# Patient Record
Sex: Male | Born: 1940 | Race: White | Hispanic: No | Smoking: Former smoker
Health system: Southern US, Community
[De-identification: ages and names within clinical notes are randomized; demographics above are authoritative.]

## PROBLEM LIST (undated history)

## (undated) DIAGNOSIS — R519 Headache, unspecified: Secondary | ICD-10-CM

## (undated) DIAGNOSIS — Z1501 Genetic susceptibility to malignant neoplasm of breast: Secondary | ICD-10-CM

## (undated) DIAGNOSIS — C50919 Malignant neoplasm of unspecified site of unspecified female breast: Secondary | ICD-10-CM

## (undated) DIAGNOSIS — N189 Chronic kidney disease, unspecified: Secondary | ICD-10-CM

## (undated) DIAGNOSIS — Z87442 Personal history of urinary calculi: Secondary | ICD-10-CM

## (undated) DIAGNOSIS — I10 Essential (primary) hypertension: Secondary | ICD-10-CM

## (undated) DIAGNOSIS — R011 Cardiac murmur, unspecified: Secondary | ICD-10-CM

## (undated) DIAGNOSIS — K219 Gastro-esophageal reflux disease without esophagitis: Secondary | ICD-10-CM

## (undated) DIAGNOSIS — E119 Type 2 diabetes mellitus without complications: Secondary | ICD-10-CM

## (undated) DIAGNOSIS — M1990 Unspecified osteoarthritis, unspecified site: Secondary | ICD-10-CM

## (undated) HISTORY — PX: MOHS SURGERY: SUR867

## (undated) HISTORY — DX: Gastro-esophageal reflux disease without esophagitis: K21.9

## (undated) HISTORY — PX: HERNIA REPAIR: SHX51

## (undated) HISTORY — DX: Malignant neoplasm of unspecified site of unspecified female breast: C50.919

## (undated) HISTORY — PX: COLONOSCOPY: SHX174

## (undated) HISTORY — PX: TONSILLECTOMY: SUR1361

## (undated) HISTORY — PX: FRACTURE SURGERY: SHX138

## (undated) HISTORY — PX: CHOLECYSTECTOMY: SHX55

---

## 2007-01-08 HISTORY — PX: MASTECTOMY: SHX3

## 2018-02-16 ENCOUNTER — Emergency Department
Admission: EM | Admit: 2018-02-16 | Discharge: 2018-02-17 | Disposition: A | Discharge status: Home / Self Care | Payer: Medicare Other | Attending: Emergency Medicine | Admitting: Emergency Medicine

## 2018-02-16 ENCOUNTER — Other Ambulatory Visit: Payer: Self-pay

## 2018-02-16 DIAGNOSIS — E119 Type 2 diabetes mellitus without complications: Secondary | ICD-10-CM | POA: Insufficient documentation

## 2018-02-16 DIAGNOSIS — I10 Essential (primary) hypertension: Secondary | ICD-10-CM | POA: Diagnosis not present

## 2018-02-16 DIAGNOSIS — R040 Epistaxis: Secondary | ICD-10-CM | POA: Insufficient documentation

## 2018-02-16 MED ORDER — TRANEXAMIC ACID 1000 MG/10ML IV SOLN
500.0000 mg | Freq: Once | INTRAVENOUS | Status: DC
  Filled 2018-02-16: qty 10

## 2018-02-16 MED ORDER — OXYMETAZOLINE HCL 0.05 % NA SOLN
1.0000 | Freq: Once | NASAL | Status: AC
  Administered 2018-02-16: 1 via NASAL
  Filled 2018-02-16: qty 30

## 2018-02-16 NOTE — ED Provider Notes (Signed)
[facility] Emergency Department Provider Note  ____________________________________________  Time seen: Approximately 11:07 PM  I have reviewed the triage vital signs and the nursing notes.   HISTORY  Chief Complaint Epistaxis   HPI Angel Salazar is a 78 y.o. male with a history of diabetes, hypertension, hyperlipidemia who presents for evaluation of epistaxis.  Patient reports epistaxis from the right nare which has been present for about an hour.  Has been unable to stop it at home.  Is on blood thinners.  He reports one prior episode of epistaxis which required a visit to the emergency room.  He takes enalapril for his blood pressure which she has taken this morning.  He denies headache or trauma.  PMH HTN DM HLD  Allergies Patient has no allergy information on record.  FH Heart disease Mother    Lymphoma Mother  non-hodgkins  Asthma Son     Social History Smoking - no Alcohol - yes Drugs - no  Review of Systems  Constitutional: Negative for fever. Eyes: Negative for visual changes. ENT: Negative for sore throat. + epistaxis Neck: No neck pain  Cardiovascular: Negative for chest pain. Respiratory: Negative for shortness of breath. Gastrointestinal: Negative for abdominal pain, vomiting or diarrhea. Genitourinary: Negative for dysuria. Musculoskeletal: Negative for back pain. Skin: Negative for rash. Neurological: Negative for headaches, weakness or numbness. Psych: No SI or HI  ____________________________________________   PHYSICAL EXAM:  VITAL SIGNS: ED Triage Vitals  Enc Vitals Group     BP 02/16/18 2201 (!) 171/93     Pulse Rate 02/16/18 2201 77     Resp 02/16/18 2201 20     Temp --      Temp src --      SpO2 02/16/18 2201 100 %     Weight 02/16/18 2202 165 lb (74.8 kg)     Height --      Head Circumference --      Peak Flow --      Pain Score 02/16/18 2202 0     Pain Loc --      Pain Edu? --      Excl. in  [place]? --     Constitutional: Alert and oriented. Well appearing and in no apparent distress. HEENT:      Head: Normocephalic and atraumatic.         Eyes: Conjunctivae are normal. Sclera is non-icteric.      Nose: No obvious source of bleeding       Mouth/Throat: Mucous membranes are moist.       Neck: Supple with no signs of meningismus. Cardiovascular: Regular rate and rhythm. No murmurs, gallops, or rubs. 2+ symmetrical distal pulses are present in all extremities. No JVD. Respiratory: Normal respiratory effort. Lungs are clear to auscultation bilaterally. No wheezes, crackles, or rhonchi.  Musculoskeletal: Nontender with normal range of motion in all extremities. No edema, cyanosis, or erythema of extremities. Neurologic: Normal speech and language. Face is symmetric. Moving all extremities. No gross focal neurologic deficits are appreciated. Skin: Skin is warm, dry and intact. No rash noted. Psychiatric: Mood and affect are normal. Speech and behavior are normal.  ____________________________________________   LABS (all labs ordered are listed, but only abnormal results are displayed)  Labs Reviewed - No data to display ____________________________________________  EKG  none  ____________________________________________  RADIOLOGY  none  ____________________________________________   PROCEDURES  Procedure(s) performed: yes Procedures   Epistaxis Management Date/Time: 02/16/2018 at 11:09 PM Performed by: Rudene Re Authorized  by: Rudene Re Consent: Verbal consent obtained. Written consent not obtained. Risks and benefits: risks, benefits and alternatives were discussed Consent given by: patient Required items: required blood products, implants, devices, and special equipment available Patient sedated: no Repair method: afrin and pressure Post-procedure assessment: bleeding stopped after 15 minutes with gauze in place Treatment complexity:  simple Patient tolerance: Patient tolerated the procedure well with no immediate complications   Critical Care performed:  None ____________________________________________   INITIAL IMPRESSION / ASSESSMENT AND PLAN / ED COURSE   78 y.o. male with a history of diabetes, hypertension, hyperlipidemia who presents for evaluation of epistaxis.  Upon arrival to the room Afrin was applied and pressure for 20 minutes.  After that, I evaluated patient with no active bleeding and no obvious source of bleeding on the anterior nasopharynx. Patient monitored for 3min and dc home on afrin and f/u with PCP      As part of my medical decision making, I reviewed the following data within the [facility] notes reviewed and incorporated, Old chart reviewed, Notes from prior ED visits and [place] Controlled Substance Database    Pertinent labs & imaging results that were available during my care of the patient were reviewed by me and considered in my medical decision making (see chart for details).    ____________________________________________   FINAL CLINICAL IMPRESSION(S) / ED DIAGNOSES  Final diagnoses:  Epistaxis      NEW MEDICATIONS STARTED DURING THIS VISIT:  ED Discharge Orders    None       Note:  This document was prepared using Dragon voice recognition software and may include unintentional dictation errors.    Rudene Re, MD 02/16/18 [phone]

## 2018-02-16 NOTE — ED Notes (Signed)
Pt stated that his nose started bleeding prior to coming to the ED. Pt stated that this happened once before and he was told that it was due to his nose being dry. Nose clamp was taken off to admin nasal spray and nose clamp then reapplied.

## 2018-02-16 NOTE — ED Triage Notes (Signed)
Pt in with co nosebleed that started prior to coming in, active bleeding noted from bilat nostrils. Pt is not on blood thinners hx of the same last year.

## 2018-02-17 NOTE — ED Notes (Signed)
No bleeding after clamp removed x 20 min

## 2021-05-10 ENCOUNTER — Other Ambulatory Visit: Payer: Self-pay | Admitting: Family Medicine

## 2021-05-10 DIAGNOSIS — N6321 Unspecified lump in the left breast, upper outer quadrant: Secondary | ICD-10-CM

## 2021-05-18 ENCOUNTER — Other Ambulatory Visit: Payer: Self-pay | Admitting: Family Medicine

## 2021-05-18 DIAGNOSIS — N6321 Unspecified lump in the left breast, upper outer quadrant: Secondary | ICD-10-CM

## 2021-05-22 ENCOUNTER — Ambulatory Visit
Admission: RE | Admit: 2021-05-22 | Discharge: 2021-05-22 | Disposition: A | Discharge status: Home / Self Care | Payer: Medicare Other | Source: Ambulatory Visit | Attending: Family Medicine | Admitting: Family Medicine

## 2021-05-22 DIAGNOSIS — N6321 Unspecified lump in the left breast, upper outer quadrant: Secondary | ICD-10-CM | POA: Diagnosis not present

## 2021-05-24 ENCOUNTER — Other Ambulatory Visit: Payer: Self-pay | Admitting: Family Medicine

## 2021-05-24 DIAGNOSIS — R928 Other abnormal and inconclusive findings on diagnostic imaging of breast: Secondary | ICD-10-CM

## 2021-05-24 DIAGNOSIS — N63 Unspecified lump in unspecified breast: Secondary | ICD-10-CM

## 2021-06-06 ENCOUNTER — Ambulatory Visit
Admission: RE | Admit: 2021-06-06 | Discharge: 2021-06-06 | Disposition: A | Discharge status: Home / Self Care | Payer: Medicare Other | Source: Ambulatory Visit | Attending: Family Medicine | Admitting: Family Medicine

## 2021-06-06 DIAGNOSIS — R928 Other abnormal and inconclusive findings on diagnostic imaging of breast: Secondary | ICD-10-CM | POA: Insufficient documentation

## 2021-06-06 DIAGNOSIS — N63 Unspecified lump in unspecified breast: Secondary | ICD-10-CM | POA: Diagnosis present

## 2021-06-06 HISTORY — PX: BREAST BIOPSY: SHX20

## 2021-06-11 ENCOUNTER — Encounter: Payer: Self-pay | Admitting: *Deleted

## 2021-06-11 ENCOUNTER — Other Ambulatory Visit: Payer: Self-pay | Admitting: *Deleted

## 2021-06-11 DIAGNOSIS — C50919 Malignant neoplasm of unspecified site of unspecified female breast: Secondary | ICD-10-CM

## 2021-06-11 NOTE — Progress Notes (Signed)
Received referral from Dr. Peyton Najjar for medical oncology appointment.   He saw the patient today and wants him seed by med onc this week.   Appt. Scheduled with Dr. Tasia Catchings for Friday 6/9.  Patient aware of appt. Details.

## 2021-06-13 ENCOUNTER — Other Ambulatory Visit: Payer: Self-pay | Admitting: General Surgery

## 2021-06-13 DIAGNOSIS — C50912 Malignant neoplasm of unspecified site of left female breast: Secondary | ICD-10-CM

## 2021-06-13 DIAGNOSIS — Z170 Estrogen receptor positive status [ER+]: Secondary | ICD-10-CM

## 2021-06-14 ENCOUNTER — Encounter: Payer: Self-pay | Admitting: Oncology

## 2021-06-14 LAB — SURGICAL PATHOLOGY

## 2021-06-15 ENCOUNTER — Other Ambulatory Visit: Payer: Self-pay

## 2021-06-15 ENCOUNTER — Ambulatory Visit: Payer: Self-pay | Admitting: General Surgery

## 2021-06-15 ENCOUNTER — Inpatient Hospital Stay: Payer: Medicare Other

## 2021-06-15 ENCOUNTER — Encounter: Payer: Self-pay | Admitting: *Deleted

## 2021-06-15 ENCOUNTER — Inpatient Hospital Stay: Payer: Medicare Other | Attending: Oncology | Admitting: Oncology

## 2021-06-15 ENCOUNTER — Encounter: Payer: Self-pay | Admitting: Oncology

## 2021-06-15 VITALS — BP 159/89 | HR 66 | Temp 97.8°F | Resp 20 | Wt 161.9 lb

## 2021-06-15 DIAGNOSIS — C50822 Malignant neoplasm of overlapping sites of left male breast: Secondary | ICD-10-CM

## 2021-06-15 DIAGNOSIS — Z170 Estrogen receptor positive status [ER+]: Secondary | ICD-10-CM

## 2021-06-15 DIAGNOSIS — Z1501 Genetic susceptibility to malignant neoplasm of breast: Secondary | ICD-10-CM

## 2021-06-15 DIAGNOSIS — C50919 Malignant neoplasm of unspecified site of unspecified female breast: Secondary | ICD-10-CM

## 2021-06-15 DIAGNOSIS — Z7189 Other specified counseling: Secondary | ICD-10-CM

## 2021-06-15 LAB — COMPREHENSIVE METABOLIC PANEL
ALT: 21 U/L (ref 0–44)
AST: 25 U/L (ref 15–41)
Albumin: 4.5 g/dL (ref 3.5–5.0)
Alkaline Phosphatase: 25 U/L — ABNORMAL LOW (ref 38–126)
Anion gap: 9 (ref 5–15)
BUN: 23 mg/dL (ref 8–23)
CO2: 26 mmol/L (ref 22–32)
Calcium: 9.3 mg/dL (ref 8.9–10.3)
Chloride: 102 mmol/L (ref 98–111)
Creatinine, Ser: 1.23 mg/dL (ref 0.61–1.24)
GFR, Estimated: >60 mL/min (ref >60)
Glucose, Bld: 122 mg/dL — ABNORMAL HIGH (ref 70–99)
Potassium: 3.9 mmol/L (ref 3.5–5.1)
Sodium: 137 mmol/L (ref 135–145)
Total Bilirubin: 1.4 mg/dL — ABNORMAL HIGH (ref 0.3–1.2)
Total Protein: 7.3 g/dL (ref 6.5–8.1)

## 2021-06-15 LAB — CBC WITH DIFFERENTIAL/PLATELET
Abs Immature Granulocytes: 0.02 10*3/uL (ref 0.00–0.07)
Basophils Absolute: 0 10*3/uL (ref 0.0–0.1)
Basophils Relative: 1 %
Eosinophils Absolute: 0.1 10*3/uL (ref 0.0–0.5)
Eosinophils Relative: 2 %
HCT: 42.6 % (ref 39.0–52.0)
Hemoglobin: 14.6 g/dL (ref 13.0–17.0)
Immature Granulocytes: 0 %
Lymphocytes Relative: 27 %
Lymphs Abs: 1.6 10*3/uL (ref 0.7–4.0)
MCH: 32.7 pg (ref 26.0–34.0)
MCHC: 34.3 g/dL (ref 30.0–36.0)
MCV: 95.3 fL (ref 80.0–100.0)
Monocytes Absolute: 0.7 10*3/uL (ref 0.1–1.0)
Monocytes Relative: 11 %
Neutro Abs: 3.6 10*3/uL (ref 1.7–7.7)
Neutrophils Relative %: 59 %
Platelets: 153 10*3/uL (ref 150–400)
RBC: 4.47 MIL/uL (ref 4.22–5.81)
RDW: 12.2 % (ref 11.5–15.5)
WBC: 6 10*3/uL (ref 4.0–10.5)
nRBC: 0 % (ref 0.0–0.2)

## 2021-06-15 NOTE — Progress Notes (Signed)
Accompanied patient to initial medical oncology appointment.   Care plan summary given to patient.   Reviewed outreach programs and cancer center services.

## 2021-06-15 NOTE — Research (Cosign Needed Addendum)
Trial: [employer] 2021-05 " Specimen Collection Study to Evaluate Biomarker in Subjects with Cancer"   Patient Angel Salazar was identified by Angel Fruit, RN as a potential candidate for the above listed study.  This Clinical Research Nurse met with Angel Salazar, [id number], on 06/15/21 in a manner and location that ensures patient privacy to discuss participation in the above listed research study.  Patient is Unaccompanied.  A copy of the informed consent document and separate HIPAA Authorization was provided to the patient.  Patient reads, speaks, and understands [place].    Patient was provided with the business card of this Nurse and encouraged to contact the research team with any questions.  Patient was provided the option of taking informed consent documents home to review and was encouraged to review at their convenience with their support network, including other care providers. Patient is comfortable with making a decision regarding study participation today.  As outlined in the informed consent form, this Nurse and Angel Salazar discussed the purpose of the research study, the investigational nature of the study, study procedures and requirements for study participation, potential risks and benefits of study participation, as well as alternatives to participation. This study is not blinded. The patient understands participation is voluntary and they may withdraw from study participation at any time.  This study does not involve randomization.  This study does not involve an investigational drug or device. This study does not involve a placebo. Patient understands enrollment is pending full eligibility review.   Confidentiality and how the patient's information will be used as part of study participation were discussed.  Patient was informed there is reimbursement provided for their time and effort spent on trial participation.  The patient is encouraged to discuss research study  participation with their insurance provider to determine what costs they may incur as part of study participation, including research related injury.    All questions were answered to patient's satisfaction.  The informed consent and separate HIPAA Authorization was reviewed page by page.  The patient's mental and emotional status is appropriate to provide informed consent, and the patient verbalizes an understanding of study participation.  Patient has agreed to participate in the above listed research study and has voluntarily signed the informed consent dated 12/06/2021 version 3 and separate HIPAA Authorization, version 5  on 06/15/21 at 10:28AM.  The patient was provided with a copy of the signed informed consent form and separate HIPAA Authorization for their reference.  No study specific procedures were obtained prior to the signing of the informed consent document.  Approximately 30 minutes were spent with the patient reviewing the informed consent documents.  After obtaining informed consent patient, voluntarily signed the optional Release of Information form for use throughout trial participation.  Angel Fruit, RN 06/15/21 11:04 AM     This Nurse has reviewed this patient's inclusion and exclusion criteria as a second review and confirms Angel Salazar is eligible for study participation.  Patient may continue with enrollment.   Angel Marion, RN, BSN, [facility] Clinical Research Nurse Lead 06/15/2021 11:05 AM  Eligibility: Eligibility criteria reviewed with patient. This Nurse has reviewed this patient's inclusion and exclusion criteria and confirmed patient is eligible for study participation. Eligibility confirmed by treating investigator, who also agrees that patient should proceed with enrollment. Patient will continue with enrollment. Blood Collection: Research blood obtained by Angel Salazar in the phlebotomy lab via fresh venipuncture per patient's preference. Patient tolerated  well without any adverse events. Gift Card: $50  gift card given to patient for her participation in this study by [employer], [place].   Medical History:  High Blood Pressure  No Coronary Artery Disease No Lupus    No Rheumatoid Arthritis  No Diabetes   No      If yes, which type?      N/A  Lynch Syndrome  No  Is the patient currently taking a magnesium supplement?   No If yes, dose and frequency? N/A  Indication? N/A  Start date? N/A   Does the patient have a personal history of cancer (greater than 5 years ago)?  Yes If yes, Cancer type and date of diagnosis?   Breast  Has this previous diagnosis been treated? Yes      If so, treatment type? Total mastectomy and Tamoxifen for 5 years    Start and end dates of last treatment cycle? 2009-2014  Does the patient have a family history of cancer in 1st or 2nd degree relatives? Yes If yes, Relationship(s) and Cancer type(s)? Mother ( Lung NHL) Grandfather ( paternal )- CLL, Grandfather ( maternal )- unknown, Grandmother ( paternal )- Unknown  Does the patient have history of alcohol consumption? Yes   If yes, current or former? Current If former, year stopped? N/A  Number of years? 75 Drinks per week? 7  Does the patient have history of cigarette, cigar, pipe, or chewing tobacco use?  Yes If yes, current for former? Former If yes, type (Cigarette, cigar, pipe, and/or chewing tobacco)? Cigarette / Pipe  If former, year stopped? 1976 Number of years? 14 Packs/number/containers per day? Angel [place], RN 06/15/21 11:19 AM

## 2021-06-15 NOTE — H&P (Signed)
PATIENT PROFILE: Angel Salazar is a 81 y.o. male who presents to the Clinic for consultation at the request of Dr. Baldemar Lenis for evaluation of left breast cancer.  PCP: Barnabas Lister, MD  HISTORY OF PRESENT ILLNESS: Angel Salazar reports he felt a mass in the lateral side of the left breast. He mentioned it to his PCP. Diagnostic mammogram and ultrasound was ordered. Diagnostic mammogram and ultrasound shows a 1 cm mass. I personally evaluated the images. This led to core biopsy. 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 FISH in process.  Patient has history of right breast cancer in 2005. He had mastectomy with sentinel node biopsy. 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 She received tamoxifen for 5 years. No radiation.  Family history of breast cancer: None Family history of other cancers: Multiple family members with blood cancer Menarche: N/A Menopause: N/A Used OCP: N/A Used estrogen and progesterone therapy: N/A History of Radiation to the chest: None  PROBLEM LIST: Problem List Date Reviewed: 05/10/2021   Noted  GERD (gastroesophageal reflux disease) 02/24/2020  Esophageal dysphagia 02/24/2020  Colon cancer screening 02/24/2020  Chronic renal insufficiency, stage 3 (moderate) (CMS-HCC) 02/24/2020  Type 2 diabetes mellitus without complication, without long-term current use of insulin (CMS-HCC) 10/06/2017  Hypercholesterolemia 10/06/2017  Essential hypertension 10/06/2017  BPH associated with nocturia 10/06/2017   GENERAL REVIEW OF SYSTEMS:   General ROS: negative for - chills, fatigue, fever, weight gain or weight loss Allergy and Immunology ROS: negative for - hives  Hematological and Lymphatic ROS: negative for - bleeding problems or bruising, negative for palpable nodes Endocrine ROS: negative for - heat or cold intolerance, hair changes Respiratory ROS: negative for - cough, shortness of  breath or wheezing Cardiovascular ROS: no chest pain or palpitations GI ROS: negative for nausea, vomiting, abdominal pain, diarrhea, constipation Musculoskeletal ROS: negative for - joint swelling or muscle pain Neurological ROS: negative for - confusion, syncope Dermatological ROS: negative for pruritus and rash Psychiatric: negative for anxiety, depression, difficulty sleeping and memory loss  MEDICATIONS: Current Outpatient Medications  Medication Sig Dispense Refill  acetaminophen (TYLENOL) 500 MG tablet Take 1,000 mg by mouth as needed for Pain  enalapril (VASOTEC) 10 MG tablet TAKE 1 TABLET(10 MG) BY MOUTH EVERY DAY 90 tablet 1  finasteride (PROSCAR) 5 mg tablet TAKE 1 TABLET(5 MG) BY MOUTH EVERY DAY 90 tablet 1  meloxicam (MOBIC) 15 MG tablet TAKE 1 TABLET BY MOUTH DAILY CONTINOUSLY AS NEEDED 30 tablet 1  metFORMIN (GLUCOPHAGE-XR) 500 MG XR tablet TAKE 1 TABLET(500 MG) BY MOUTH DAILY WITH DINNER 90 tablet 1  omeprazole (PRILOSEC) 40 MG DR capsule TAKE 1 CAPSULE(40 MG) BY MOUTH EVERY DAY 30 capsule 11  rosuvastatin (CRESTOR) 10 MG tablet TAKE 1 TABLET(10 MG) BY MOUTH EVERY DAY 90 tablet 1  aspirin 81 MG EC tablet Take 1 tablet (81 mg total) by mouth once daily (Patient not taking: Reported on 06/11/2021) 30 tablet 11   No current facility-administered medications for this visit.   ALLERGIES: Patient has no known allergies.  PAST MEDICAL HISTORY: Past Medical History:  Diagnosis Date  Cancer (CMS-HCC)  breast  Cataract cortical, senile  Diabetes mellitus type 2, uncomplicated (CMS-HCC)  Hyperlipidemia  Hypertension  Kidney stones  Skin cancer   PAST SURGICAL HISTORY: Past Surgical History:  Procedure Laterality Date  EGD 04/04/2020  Gastritis/No repeat/TKT  CHOLECYSTECTOMY  HERNIA REPAIR Bilateral  inguinal  MASTECTOMY PARTIAL Right  TONSILLECTOMY  FAMILY HISTORY: Family History  Problem Relation Age of Onset  Heart disease Mother  Lymphoma Mother   non-hodgkins  Asthma Son    SOCIAL HISTORY: Social History   Socioeconomic History  Marital status: Divorced  Tobacco Use  Smoking status: Former  Types: Cigarettes  Quit date: 1976  Years since quitting: 47.4  Smokeless tobacco: Never  Vaping Use  Vaping Use: Never used  Substance and Sexual Activity  Alcohol use: Yes  Drug use: Not Currently  Sexual activity: Not Currently  Partners: Female   PHYSICAL EXAM: Vitals:  06/11/21 1018  BP: (!) 157/85  Pulse: 80   Body mass index is 25.99 kg/m. Weight: 73 kg (161 lb)   GENERAL: Alert, active, oriented x3  HEENT: Pupils equal reactive to light. Extraocular movements are intact. Sclera clear. Palpebral conjunctiva normal red color.Pharynx clear.  NECK: Supple with no palpable mass and no adenopathy.  LUNGS: Sound clear with no rales rhonchi or wheezes.  HEART: Regular rhythm S1 and S2 without murmur.  BREAST: abnormal mass palpable on the left breast at 3 o'clock position. There is a bruise from recent biopsy.  ABDOMEN: Soft and depressible, nontender with no palpable mass, no hepatomegaly.  EXTREMITIES: Well-developed well-nourished symmetrical with no dependent edema.  NEUROLOGICAL: Awake alert oriented, facial expression symmetrical, moving all extremities.  REVIEW OF DATA: I have reviewed the following data today: Appointment on 05/03/2021  Component Date Value  WBC (White Blood Cell Co* 05/03/2021 5.5  RBC (Red Blood Cell Coun* 05/03/2021 4.63 (L)  Hemoglobin 05/03/2021 15.1  Hematocrit 05/03/2021 43.3  MCV (Mean Corpuscular Vo* 05/03/2021 93.5  MCH (Mean Corpuscular He* 05/03/2021 32.6 (H)  MCHC (Mean Corpuscular H* 05/03/2021 34.9  Platelet Count 05/03/2021 145 (L)  RDW-CV (Red Cell Distrib* 05/03/2021 12.3  MPV (Mean Platelet Volum* 05/03/2021 10.0  Neutrophils 05/03/2021 2.80  Lymphocytes 05/03/2021 2.00  Mixed Count 05/03/2021 0.70  Neutrophil % 05/03/2021 51.0  Lymphocyte % 05/03/2021 37.0   Mixed % 05/03/2021 12.0  Glucose 05/03/2021 115 (H)  Sodium 05/03/2021 141  Potassium 05/03/2021 4.4  Chloride 05/03/2021 105  Carbon Dioxide (CO2) 05/03/2021 29.8  Urea Nitrogen (BUN) 05/03/2021 27 (H)  Creatinine 05/03/2021 1.3  Glomerular Filtration Ra* 05/03/2021 53 (L)  Calcium 05/03/2021 10.0  AST 05/03/2021 19  ALT 05/03/2021 21  Alk Phos (alkaline Phosp* 05/03/2021 25 (L)  Albumin 05/03/2021 4.4  Bilirubin, Total 05/03/2021 1.1  Protein, Total 05/03/2021 6.6  A/G Ratio 05/03/2021 2.0  Hemoglobin A1C 05/03/2021 6.0 (H)  Average Blood Glucose (C* 05/03/2021 126  Cholesterol, Total 05/03/2021 146  Triglyceride 05/03/2021 93  HDL (High Density Lipopr* 05/03/2021 77.6 (H)  LDL Calculated 05/03/2021 50  VLDL Cholesterol 05/03/2021 19  Cholesterol/HDL Ratio 05/03/2021 1.9  PSA (Prostate Specific A* 05/03/2021 1.50  Thyroid Stimulating Horm* 05/03/2021 2.487    ASSESSMENT: Mr. Docter is a 81 y.o. male presenting for consultation for left breast cancer.   Patient was oriented again about the pathology results. Surgical alternatives were discussed with patient including partial vs total mastectomy. Surgical technique and post operative care was discussed with patient. Risk of surgery was discussed with patient including but not limited to: wound infection, seroma, hematoma, brachial plexopathy, mondor's disease (thrombosis of small veins of breast), chronic wound pain, breast lymphedema, altered sensation to the nipple and cosmesis among others.   Patient has history of right breast cancer treated with total mastectomy and sentinel node biopsy. He received adjuvant antiestrogen therapy for 5 years. No radiation therapy needed.  He was positive  for BRCA2. Both daughters negative for BRCA abnormality. He has a son that has not been tested.  Patient now with invasive mammary carcinoma, no special type. 1 mm size mass on ultrasound. Disease ER positive, PR positive, HER2 difficult.  FISH is in process.  Pending medical oncology evaluation.  I will avoid medical oncology final decision before deciding to proceed with total mastectomy and sentinel needle biopsy.  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 Left total mastectomy with axillary sentinel lymph node biopsy  Patient and and daughter that was on the phone verbalized understanding, all questions were answered, and were agreeable with the plan outlined above.   Herbert Pun, MD

## 2021-06-15 NOTE — H&P (View-Only) (Signed)
PATIENT PROFILE: Angel Salazar is a 81 y.o. male who presents to the Clinic for consultation at the request of Dr. Baldemar Lenis for evaluation of left breast cancer.  PCP: Barnabas Lister, MD  HISTORY OF PRESENT ILLNESS: Angel Salazar reports he felt a mass in the lateral side of the left breast. He mentioned it to his PCP. Diagnostic mammogram and ultrasound was ordered. Diagnostic mammogram and ultrasound shows a 1 cm mass. I personally evaluated the images. This led to core biopsy. 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 FISH in process.  Patient has history of right breast cancer in 2005. He had mastectomy with sentinel node biopsy. 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 She received tamoxifen for 5 years. No radiation.  Family history of breast cancer: None Family history of other cancers: Multiple family members with blood cancer Menarche: N/A Menopause: N/A Used OCP: N/A Used estrogen and progesterone therapy: N/A History of Radiation to the chest: None  PROBLEM LIST: Problem List Date Reviewed: 05/10/2021   Noted  GERD (gastroesophageal reflux disease) 02/24/2020  Esophageal dysphagia 02/24/2020  Colon cancer screening 02/24/2020  Chronic renal insufficiency, stage 3 (moderate) (CMS-HCC) 02/24/2020  Type 2 diabetes mellitus without complication, without long-term current use of insulin (CMS-HCC) 10/06/2017  Hypercholesterolemia 10/06/2017  Essential hypertension 10/06/2017  BPH associated with nocturia 10/06/2017   GENERAL REVIEW OF SYSTEMS:   General ROS: negative for - chills, fatigue, fever, weight gain or weight loss Allergy and Immunology ROS: negative for - hives  Hematological and Lymphatic ROS: negative for - bleeding problems or bruising, negative for palpable nodes Endocrine ROS: negative for - heat or cold intolerance, hair changes Respiratory ROS: negative for - cough, shortness of  breath or wheezing Cardiovascular ROS: no chest pain or palpitations GI ROS: negative for nausea, vomiting, abdominal pain, diarrhea, constipation Musculoskeletal ROS: negative for - joint swelling or muscle pain Neurological ROS: negative for - confusion, syncope Dermatological ROS: negative for pruritus and rash Psychiatric: negative for anxiety, depression, difficulty sleeping and memory loss  MEDICATIONS: Current Outpatient Medications  Medication Sig Dispense Refill  acetaminophen (TYLENOL) 500 MG tablet Take 1,000 mg by mouth as needed for Pain  enalapril (VASOTEC) 10 MG tablet TAKE 1 TABLET(10 MG) BY MOUTH EVERY DAY 90 tablet 1  finasteride (PROSCAR) 5 mg tablet TAKE 1 TABLET(5 MG) BY MOUTH EVERY DAY 90 tablet 1  meloxicam (MOBIC) 15 MG tablet TAKE 1 TABLET BY MOUTH DAILY CONTINOUSLY AS NEEDED 30 tablet 1  metFORMIN (GLUCOPHAGE-XR) 500 MG XR tablet TAKE 1 TABLET(500 MG) BY MOUTH DAILY WITH DINNER 90 tablet 1  omeprazole (PRILOSEC) 40 MG DR capsule TAKE 1 CAPSULE(40 MG) BY MOUTH EVERY DAY 30 capsule 11  rosuvastatin (CRESTOR) 10 MG tablet TAKE 1 TABLET(10 MG) BY MOUTH EVERY DAY 90 tablet 1  aspirin 81 MG EC tablet Take 1 tablet (81 mg total) by mouth once daily (Patient not taking: Reported on 06/11/2021) 30 tablet 11   No current facility-administered medications for this visit.   ALLERGIES: Patient has no known allergies.  PAST MEDICAL HISTORY: Past Medical History:  Diagnosis Date  Cancer (CMS-HCC)  breast  Cataract cortical, senile  Diabetes mellitus type 2, uncomplicated (CMS-HCC)  Hyperlipidemia  Hypertension  Kidney stones  Skin cancer   PAST SURGICAL HISTORY: Past Surgical History:  Procedure Laterality Date  EGD 04/04/2020  Gastritis/No repeat/TKT  CHOLECYSTECTOMY  HERNIA REPAIR Bilateral  inguinal  MASTECTOMY PARTIAL Right  TONSILLECTOMY  FAMILY HISTORY: Family History  Problem Relation Age of Onset  Heart disease Mother  Lymphoma Mother   non-hodgkins  Asthma Son    SOCIAL HISTORY: Social History   Socioeconomic History  Marital status: Divorced  Tobacco Use  Smoking status: Former  Types: Cigarettes  Quit date: 1976  Years since quitting: 47.4  Smokeless tobacco: Never  Vaping Use  Vaping Use: Never used  Substance and Sexual Activity  Alcohol use: Yes  Drug use: Not Currently  Sexual activity: Not Currently  Partners: Female   PHYSICAL EXAM: Vitals:  06/11/21 1018  BP: (!) 157/85  Pulse: 80   Body mass index is 25.99 kg/m. Weight: 73 kg (161 lb)   GENERAL: Alert, active, oriented x3  HEENT: Pupils equal reactive to light. Extraocular movements are intact. Sclera clear. Palpebral conjunctiva normal red color.Pharynx clear.  NECK: Supple with no palpable mass and no adenopathy.  LUNGS: Sound clear with no rales rhonchi or wheezes.  HEART: Regular rhythm S1 and S2 without murmur.  BREAST: abnormal mass palpable on the left breast at 3 o'clock position. There is a bruise from recent biopsy.  ABDOMEN: Soft and depressible, nontender with no palpable mass, no hepatomegaly.  EXTREMITIES: Well-developed well-nourished symmetrical with no dependent edema.  NEUROLOGICAL: Awake alert oriented, facial expression symmetrical, moving all extremities.  REVIEW OF DATA: I have reviewed the following data today: Appointment on 05/03/2021  Component Date Value  WBC (White Blood Cell Co* 05/03/2021 5.5  RBC (Red Blood Cell Coun* 05/03/2021 4.63 (L)  Hemoglobin 05/03/2021 15.1  Hematocrit 05/03/2021 43.3  MCV (Mean Corpuscular Vo* 05/03/2021 93.5  MCH (Mean Corpuscular He* 05/03/2021 32.6 (H)  MCHC (Mean Corpuscular H* 05/03/2021 34.9  Platelet Count 05/03/2021 145 (L)  RDW-CV (Red Cell Distrib* 05/03/2021 12.3  MPV (Mean Platelet Volum* 05/03/2021 10.0  Neutrophils 05/03/2021 2.80  Lymphocytes 05/03/2021 2.00  Mixed Count 05/03/2021 0.70  Neutrophil % 05/03/2021 51.0  Lymphocyte % 05/03/2021 37.0   Mixed % 05/03/2021 12.0  Glucose 05/03/2021 115 (H)  Sodium 05/03/2021 141  Potassium 05/03/2021 4.4  Chloride 05/03/2021 105  Carbon Dioxide (CO2) 05/03/2021 29.8  Urea Nitrogen (BUN) 05/03/2021 27 (H)  Creatinine 05/03/2021 1.3  Glomerular Filtration Ra* 05/03/2021 53 (L)  Calcium 05/03/2021 10.0  AST 05/03/2021 19  ALT 05/03/2021 21  Alk Phos (alkaline Phosp* 05/03/2021 25 (L)  Albumin 05/03/2021 4.4  Bilirubin, Total 05/03/2021 1.1  Protein, Total 05/03/2021 6.6  A/G Ratio 05/03/2021 2.0  Hemoglobin A1C 05/03/2021 6.0 (H)  Average Blood Glucose (C* 05/03/2021 126  Cholesterol, Total 05/03/2021 146  Triglyceride 05/03/2021 93  HDL (High Density Lipopr* 05/03/2021 77.6 (H)  LDL Calculated 05/03/2021 50  VLDL Cholesterol 05/03/2021 19  Cholesterol/HDL Ratio 05/03/2021 1.9  PSA (Prostate Specific A* 05/03/2021 1.50  Thyroid Stimulating Horm* 05/03/2021 2.487    ASSESSMENT: Mr. Ainsley is a 80 y.o. male presenting for consultation for left breast cancer.   Patient was oriented again about the pathology results. Surgical alternatives were discussed with patient including partial vs total mastectomy. Surgical technique and post operative care was discussed with patient. Risk of surgery was discussed with patient including but not limited to: wound infection, seroma, hematoma, brachial plexopathy, mondor's disease (thrombosis of small veins of breast), chronic wound pain, breast lymphedema, altered sensation to the nipple and cosmesis among others.   Patient has history of right breast cancer treated with total mastectomy and sentinel node biopsy. He received adjuvant antiestrogen therapy for 5 years. No radiation therapy needed.  He was positive   for BRCA2. Both daughters negative for BRCA abnormality. He has a son that has not been tested.  Patient now with invasive mammary carcinoma, no special type. 1 mm size mass on ultrasound. Disease ER positive, PR positive, HER2 difficult.  FISH is in process.  Pending medical oncology evaluation.  I will avoid medical oncology final decision before deciding to proceed with total mastectomy and sentinel needle biopsy.  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 Left total mastectomy with axillary sentinel lymph node biopsy  Patient and and daughter that was on the phone verbalized understanding, all questions were answered, and were agreeable with the plan outlined above.   Herbert Pun, MD

## 2021-06-15 NOTE — Research (Signed)
Erroneous entry please disregard.  Jeral Fruit, RN 06/15/21 12:11 PM

## 2021-06-16 DIAGNOSIS — Z7189 Other specified counseling: Secondary | ICD-10-CM | POA: Insufficient documentation

## 2021-06-16 DIAGNOSIS — Z1501 Genetic susceptibility to malignant neoplasm of breast: Secondary | ICD-10-CM | POA: Insufficient documentation

## 2021-06-16 DIAGNOSIS — C50919 Malignant neoplasm of unspecified site of unspecified female breast: Secondary | ICD-10-CM | POA: Insufficient documentation

## 2021-06-16 LAB — CANCER ANTIGEN 27.29: CA 27.29: 33.6 U/mL (ref 0.0–38.6)

## 2021-06-16 NOTE — Assessment & Plan Note (Signed)
Imaging and pathology results were reviewed and discussed with patient. I agree with mastectomy with sentinel lymph node biopsy. Plan to send Oncotype DX after the surgery. Discussed with patient about adjuvant endocrine therapy.

## 2021-06-16 NOTE — Assessment & Plan Note (Signed)
Patient also has increased risk of pancreatic cancer, prostate cancer, melanoma. Dermatology evaluation annually. Prostate cancer screening

## 2021-06-16 NOTE — Progress Notes (Signed)
Hematology/Oncology Consult note [phone] [phone]         Patient Care Team: Medicine, [facility] Family as PCP - General  REFERRING PROVIDER: Herbert Pun, *  CHIEF COMPLAINTS/REASON FOR VISIT:  Breast cancer  HISTORY OF PRESENTING ILLNESS:   Angel Salazar is a  81 y.o.  male with PMH listed below was seen in consultation at the request of  Herbert Pun, *  for evaluation of breast cancer.  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  Patient took 5 years of tamoxifen.  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  Oncology History  Invasive carcinoma of breast ([place])  05/22/2021 Mammogram   Left breast mammogram showed 3 o'clock location of the LEFT breast. Ultrasound left breast showed mass is estimated to measure 1 x 0.3 x 0.7 cm.  Left axilla is negative for adenopathy.   06/06/2021 Cancer Staging   Staging form: Breast, AJCC 8th Edition - Clinical stage from 06/06/2021: Stage IA (cT1b, cN0, cM0, G2, ER+, PR+, HER2-) - Signed by Earlie Server, MD on 06/16/2021 Stage prefix: Initial diagnosis Histologic grading system: 3 grade system HER2-IHC interpretation: Equivocal HER2-IHC value: Score 2+ HER2-FISH interpretation: Negative   06/16/2021 Initial Diagnosis   Invasive carcinoma of breast ([place])    Genetic Testing   patient reports that he has had genetic testing done in the past.  He has BRCA2 mutation         Review of Systems  Constitutional:  Negative for appetite change, chills, fatigue, fever and unexpected weight change.  HENT:   Negative for hearing loss and voice change.   Eyes:  Negative for eye problems and icterus.  Respiratory:  Negative for chest tightness, cough and shortness of breath.   Cardiovascular:  Negative for chest pain and leg swelling.  Gastrointestinal:  Negative for abdominal distention and abdominal pain.  Endocrine: Negative for hot  flashes.  Genitourinary:  Negative for difficulty urinating, dysuria and frequency.   Musculoskeletal:  Negative for arthralgias.  Skin:  Negative for itching and rash.  Neurological:  Negative for light-headedness and numbness.  Hematological:  Negative for adenopathy. Does not bruise/bleed easily.  Psychiatric/Behavioral:  Negative for confusion.     MEDICAL HISTORY:  Past Medical History:  Diagnosis Date   Breast cancer ([place])    GERD (gastroesophageal reflux disease)     SURGICAL HISTORY: Past Surgical History:  Procedure Laterality Date   BREAST BIOPSY Left 06/06/2021   MASTECTOMY Right 2009    SOCIAL HISTORY: Social History   Socioeconomic History   Marital status: Divorced    Spouse name: Not on file   Number of children: Not on file   Years of education: Not on file   Highest education level: Not on file  Occupational History   Not on file  Tobacco Use   Smoking status: Never   Smokeless tobacco: Never  Substance and Sexual Activity   Alcohol use: Yes   Drug use: Not Currently   Sexual activity: Not Currently    Birth control/protection: None  Other Topics Concern   Not on file  Social History Narrative   Not on file   Social Determinants of Health   Financial Resource Strain: Not on file  Food Insecurity: Not on file  Transportation Needs: Not on file  Physical Activity: Not on file  Stress: Not on file  Social Connections: Not on file  Intimate Partner Violence: Not on file    FAMILY HISTORY:  History reviewed. No pertinent family history.  ALLERGIES:  has No Known Allergies.  MEDICATIONS:  Current Outpatient Medications  Medication Sig Dispense Refill   enalapril (VASOTEC) 10 MG tablet Take 10 mg by mouth daily.     finasteride (PROSCAR) 5 MG tablet Take 5 mg by mouth daily.     metFORMIN (GLUCOPHAGE-XR) 500 MG 24 hr tablet Take 500 mg by mouth daily.     omeprazole (PRILOSEC) 40 MG capsule Take 40 mg by mouth daily.     rosuvastatin  (CRESTOR) 10 MG tablet Take 10 mg by mouth at bedtime.     acetaminophen (TYLENOL) 650 MG CR tablet Take 1,300 mg by mouth every 8 (eight) hours as needed for pain.     Alpha-Lipoic Acid 600 MG TABS Take 600 mg by mouth daily.     aspirin EC 81 MG tablet Take 81 mg by mouth daily. Swallow whole.     carboxymethylcellulose (REFRESH PLUS) 0.5 % SOLN Place 1 drop into both eyes daily as needed (dry eyes).     Cholecalciferol (VITAMIN D) 125 MCG (5000 UT) CAPS Take 5,000 Units by mouth daily.     Coenzyme Q10 (COQ-10) 400 MG CAPS Take 400 mg by mouth daily.     fexofenadine (ALLEGRA) 180 MG tablet Take 180 mg by mouth daily as needed for allergies or rhinitis.     folic acid (FOLVITE) 094 MCG tablet Take 800 mcg by mouth daily.     glucosamine-chondroitin 500-400 MG tablet Take 1 tablet by mouth 3 (three) times daily.     LUTEIN PO Take 1 tablet by mouth daily.     Multiple Vitamin (MULTIVITAMIN) capsule Take 1 capsule by mouth daily.     Omega 3-6-9 Fatty Acids (TRIPLE OMEGA-3-6-9) CAPS Take 1 capsule by mouth 3 (three) times daily.     Probiotic Product (PROBIOTIC-10 ULTIMATE) CAPS Take 1 capsule by mouth daily.     saline (AYR) GEL Place 1 application  into both nostrils every 6 (six) hours as needed (dry nasal passages).     TURMERIC CURCUMIN PO Take 1,750 mg by mouth daily.     vitamin B-12 (CYANOCOBALAMIN) 1000 MCG tablet Take 1,000 mcg by mouth daily.     No current facility-administered medications for this visit.     PHYSICAL EXAMINATION: ECOG PERFORMANCE STATUS: 0 - Asymptomatic Vitals:   06/15/21 0922  BP: (!) 159/89  Pulse: 66  Resp: 20  Temp: 97.8 F (36.6 C)  SpO2: 100%   Filed Weights   06/15/21 0922  Weight: 161 lb 14.4 oz (73.4 kg)    Physical Exam Constitutional:      General: He is not in acute distress. HENT:     Head: Normocephalic and atraumatic.  Eyes:     General: No scleral icterus. Cardiovascular:     Rate and Rhythm: Normal rate and regular rhythm.      Heart sounds: Normal heart sounds.  Pulmonary:     Effort: Pulmonary effort is normal. No respiratory distress.     Breath sounds: No wheezing.  Abdominal:     General: Bowel sounds are normal. There is no distension.     Palpations: Abdomen is soft.  Musculoskeletal:        General: No deformity. Normal range of motion.     Cervical back: Normal range of motion and neck supple.  Skin:    General: Skin is warm and dry.     Findings: No erythema or rash.  Neurological:  Mental Status: He is alert and oriented to person, place, and time. Mental status is at baseline.     Cranial Nerves: No cranial nerve deficit.     Coordination: Coordination normal.  Psychiatric:        Mood and Affect: Mood normal.   Breast exam was performed in seated and lying down position. Post right mastectomy. There is a palpable 0.5cm cystic lesion at previous mastectomy site. Left breast status postbiopsy with focal ecchymosis/bruising.  Palpable 3:00 left breast mass.   LABORATORY DATA:  I have reviewed the data as listed Lab Results  Component Value Date   WBC 6.0 06/15/2021   HGB 14.6 06/15/2021   HCT 42.6 06/15/2021   MCV 95.3 06/15/2021   PLT 153 06/15/2021   Recent Labs    06/15/21 1046  NA 137  K 3.9  CL 102  CO2 26  GLUCOSE 122*  BUN 23  CREATININE 1.23  CALCIUM 9.3  GFRNONAA >60  PROT 7.3  ALBUMIN 4.5  AST 25  ALT 21  ALKPHOS 25*  BILITOT 1.4*   Iron/TIBC/Ferritin/ %Sat No results found for: "IRON", "TIBC", "FERRITIN", "IRONPCTSAT"    RADIOGRAPHIC STUDIES: I have personally reviewed the radiological images as listed and agreed with the findings in the report. [place] LT BREAST BX W LOC DEV 1ST LESION IMG BX SPEC US GUIDE  Addendum Date: 06/08/2021   ADDENDUM REPORT: 06/08/2021 13:20 ADDENDUM: Pathology revealed GRADE 2 INVASIVE MAMMARY CARCINOMA, NO SPECIAL TYPE, DUCTAL CARCINOMA IN SITU of the LEFT breast 3 o'clock, 1 cmfn (heart clip). This was found to be concordant  by Dr. Zerita Boers. The patient reported doing well after the biopsy with tenderness at the site. Post biopsy instructions and care were reviewed and questions were answered. The patient was encouraged to call [facility] for any additional concerns. Pathology results were discussed with the patient by telephone with Dr. Derinda Late of [facility] who will arrange surgical referral. Pathology results reported by Stacie Acres RN on 06/08/2021. Electronically Signed   By: Zerita Boers M.D.   On: 06/08/2021 13:20   Result Date: 06/08/2021 CLINICAL DATA:  Here for ultrasound-guided biopsy of a mass in the left breast. EXAM: ULTRASOUND GUIDED LEFT BREAST CORE NEEDLE BIOPSY COMPARISON:  Multiple prior exams. PROCEDURE: I met with the patient and we discussed the procedure of ultrasound-guided biopsy, including benefits and alternatives. We discussed the high likelihood of a successful procedure. We discussed the risks of the procedure, including infection, bleeding, tissue injury, clip migration, and inadequate sampling. Informed written consent was given. The usual time-out protocol was performed immediately prior to the procedure. Lesion quadrant: Upper outer quadrant Using sterile technique and 1% Lidocaine as local anesthetic, under direct ultrasound visualization, a 12 gauge spring-loaded device was used to perform biopsy of an irregular mass in the left breast at 3 o'clock 1 cm nipple using a lateral approach. At the conclusion of the procedure a heart shaped tissue marker clip was deployed into the biopsy cavity. Follow up 2 view mammogram was performed and dictated separately. IMPRESSION: Ultrasound guided biopsy of the left breast. No apparent complications. Electronically Signed: By: Zerita Boers M.D. On: 06/06/2021 09:22   MM CLIP PLACEMENT LEFT  Result Date: 06/06/2021 CLINICAL DATA:  Status post ultrasound-guided biopsy of a mass in the  left breast. EXAM: 3D DIAGNOSTIC LEFT MAMMOGRAM POST ULTRASOUND BIOPSY COMPARISON:  Previous exam(s). FINDINGS: 3D Mammographic images were obtained following ultrasound guided biopsy  of a mass in the left breast. The biopsy marking clip is in expected position at the site of biopsy. IMPRESSION: Appropriate positioning of the heart shaped biopsy marking clip at the site of biopsy in the left breast. Final Assessment: Post Procedure Mammograms for Marker Placement Electronically Signed   By: Zerita Boers M.D.   On: 06/06/2021 08:52  MM DIAG BREAST TOMO UNI LEFT  Result Date: 05/22/2021 CLINICAL DATA:  Palpable mass in the LEFT breast. 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 Patient has no family history of breast cancer. EXAM: DIGITAL DIAGNOSTIC UNILATERAL LEFT MAMMOGRAM WITH TOMOSYNTHESIS AND CAD; ULTRASOUND LEFT BREAST LIMITED TECHNIQUE: Left digital diagnostic mammography and breast tomosynthesis was performed. The images were evaluated with computer-aided detection.; Targeted ultrasound examination of the left breast was performed. COMPARISON:  Previous images are not available; reports are available in Epic from [facility] from May of 2009 ACR Breast Density Category b: There are scattered areas of fibroglandular density. FINDINGS: BB marks area of concern, corresponding to an irregular mass with indistinct margins in the LATERAL portion of the LEFT breast. There are associated internal calcifications. On physical exam, palpate a discrete superficial firm mobile mass in the 3 o'clock location of the LEFT breast 1 centimeter from the nipple. Targeted ultrasound is performed, showing an oval mass with indistinct margins in the 3 o'clock location of the LEFT breast 1 centimeter from the nipple. Mass is indistinct, making assessment of size less accurate. However, mass is  estimated to measure 1.0 x 0.3 x 0.7 centimeters. Internal blood flow is present by Doppler evaluation. Evaluation of the LEFT axilla is negative for adenopathy. IMPRESSION: Suspicious mass in the 3 o'clock location of the LEFT breast for which biopsy is recommended. No LEFT axillary adenopathy. RECOMMENDATION: Ultrasound-guided core biopsy of LEFT breast mass. I have discussed the findings and recommendations with the patient. If applicable, a reminder letter will be sent to the patient regarding the next appointment. BI-RADS CATEGORY  4: Suspicious. Electronically Signed   By: Nolon Nations M.D.   On: 05/22/2021 12:13  US BREAST LTD UNI LEFT INC AXILLA  Result Date: 05/22/2021 CLINICAL DATA:  Palpable mass in the LEFT breast. 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 Patient has no family history of breast cancer. EXAM: DIGITAL DIAGNOSTIC UNILATERAL LEFT MAMMOGRAM WITH TOMOSYNTHESIS AND CAD; ULTRASOUND LEFT BREAST LIMITED TECHNIQUE: Left digital diagnostic mammography and breast tomosynthesis was performed. The images were evaluated with computer-aided detection.; Targeted ultrasound examination of the left breast was performed. COMPARISON:  Previous images are not available; reports are available in Epic from [facility] from May of 2009 ACR Breast Density Category b: There are scattered areas of fibroglandular density. FINDINGS: BB marks area of concern, corresponding to an irregular mass with indistinct margins in the LATERAL portion of the LEFT breast. There are associated internal calcifications. On physical exam, palpate a discrete superficial firm mobile mass in the 3 o'clock location of the LEFT breast 1 centimeter from the nipple. Targeted ultrasound is performed, showing an oval mass with indistinct margins in the 3 o'clock location of the LEFT breast 1 centimeter from  the nipple. Mass is indistinct, making assessment of size less accurate. However, mass is estimated to measure 1.0 x 0.3 x 0.7 centimeters. Internal blood  flow is present by Doppler evaluation. Evaluation of the LEFT axilla is negative for adenopathy. IMPRESSION: Suspicious mass in the 3 o'clock location of the LEFT breast for which biopsy is recommended. No LEFT axillary adenopathy. RECOMMENDATION: Ultrasound-guided core biopsy of LEFT breast mass. I have discussed the findings and recommendations with the patient. If applicable, a reminder letter will be sent to the patient regarding the next appointment. BI-RADS CATEGORY  4: Suspicious. Electronically Signed   By: Nolon Nations M.D.   On: 05/22/2021 12:13     ASSESSMENT & PLAN:   Cancer Staging  Invasive carcinoma of breast ([place]) Staging form: Breast, AJCC 8th Edition - Clinical stage from 06/06/2021: Stage IA (cT1b, cN0, cM0, G2, ER+, PR+, HER2-) - Signed by Earlie Server, MD on 06/16/2021   Invasive carcinoma of breast [facility]) Imaging and pathology results were reviewed and discussed with patient. I agree with mastectomy with sentinel lymph node biopsy. Plan to send Oncotype DX after the surgery. Discussed with patient about adjuvant endocrine therapy.  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 Dermatology evaluation annually. Prostate cancer screening    Check CBC CMP breast cancer tumor markers.  Discussed with patient about exact science research trial.   Follow-up appointment TBD.  I plan to see patient 2 weeks after the surgery to review pathology results.  Orders Placed This Encounter  Procedures   CBC with Differential    Standing Status:   Future    Number of Occurrences:   1    Standing Expiration Date:   06/15/2022   Comprehensive metabolic panel    Standing Status:    Future    Number of Occurrences:   1    Standing Expiration Date:   06/15/2022   Cancer antigen 27.29    Standing Status:   Future    Number of Occurrences:   1    Standing Expiration Date:   06/15/2022    All questions were answered. The patient knows to call the clinic with any problems questions or concerns.  cc Herbert Pun, *    Thank you for this kind referral and the opportunity to participate in the care of this patient. A copy of today's note is routed to referring provider   Earlie Server, MD, PhD [facility] Hematology Oncology 06/16/2021

## 2021-06-16 NOTE — Assessment & Plan Note (Signed)
Goals of plan was reviewed and discussed

## 2021-06-18 ENCOUNTER — Encounter
Admission: RE | Admit: 2021-06-18 | Discharge: 2021-06-18 | Disposition: A | Discharge status: Home / Self Care | Payer: Medicare Other | Source: Ambulatory Visit | Attending: General Surgery | Admitting: General Surgery

## 2021-06-18 DIAGNOSIS — Z01818 Encounter for other preprocedural examination: Secondary | ICD-10-CM

## 2021-06-18 DIAGNOSIS — E119 Type 2 diabetes mellitus without complications: Secondary | ICD-10-CM

## 2021-06-18 DIAGNOSIS — I10 Essential (primary) hypertension: Secondary | ICD-10-CM

## 2021-06-18 HISTORY — DX: Unspecified osteoarthritis, unspecified site: M19.90

## 2021-06-18 HISTORY — DX: Genetic susceptibility to malignant neoplasm of breast: Z15.01

## 2021-06-18 HISTORY — DX: Headache, unspecified: R51.9

## 2021-06-18 HISTORY — DX: Chronic kidney disease, unspecified: N18.9

## 2021-06-18 HISTORY — DX: Type 2 diabetes mellitus without complications: E11.9

## 2021-06-18 HISTORY — DX: Personal history of urinary calculi: Z87.442

## 2021-06-18 HISTORY — DX: Cardiac murmur, unspecified: R01.1

## 2021-06-18 HISTORY — DX: Essential (primary) hypertension: I10

## 2021-06-18 LAB — RESEARCH LABS

## 2021-06-18 NOTE — Patient Instructions (Signed)
Your procedure is scheduled on:06-22-21 Friday Report to the Registration Desk on the 1st floor of the [place].Then proceed to the 2nd desk on right Radiology Desk (Call Dr Darrol Poke office about the time)  REMEMBER: Instructions that are not followed completely may result in serious medical risk, up to and including death; or upon the discretion of your surgeon and anesthesiologist your surgery may need to be rescheduled.  Do not eat food OR drink any liquids after midnight the night before surgery.  No gum chewing, lozengers or hard candies.  TAKE THESE MEDICATIONS THE MORNING OF SURGERY WITH A SIP OF WATER: -omeprazole (PRILOSEC)  Last dose of 81 mg Aspirin was on 06-08-21  Stop your metFORMIN (GLUCOPHAGE-XR) 2 days prior to surgery-Last dose will be on 06-19-21 Tuesday  One week prior to surgery: Stop Anti-inflammatories (NSAIDS) such as Advil, Aleve, Ibuprofen, Motrin, Naproxen, Naprosyn and Aspirin based products such as Excedrin, Goodys Powder, BC Powder.You may however, take Tylenol if needed for pain up until the day of surgery. Stop ANY OVER THE COUNTER supplements/vitamins NOW (06-18-21) until after surgery.  No Alcohol for 24 hours before or after surgery.  No Smoking including e-cigarettes for 24 hours prior to surgery.  No chewable tobacco products for at least 6 hours prior to surgery.  No nicotine patches on the day of surgery.  Do not use any "recreational" drugs for at least a week prior to your surgery.  Please be advised that the combination of cocaine and anesthesia may have negative outcomes, up to and including death. If you test positive for cocaine, your surgery will be cancelled.  On the morning of surgery brush your teeth with toothpaste and water, you may rinse your mouth with mouthwash if you wish. Do not swallow any toothpaste or mouthwash.  Use CHG Soap as directed on instruction sheet.  Do not wear jewelry, make-up, hairpins, clips or nail  polish.  Do not wear lotions, powders, or perfumes.   Do not shave body from the neck down 48 hours prior to surgery just in case you cut yourself which could leave a site for infection.  Also, freshly shaved skin may become irritated if using the CHG soap.  Contact lenses, hearing aids and dentures may not be worn into surgery.  Do not bring valuables to the hospital. [facility] is not responsible for any missing/lost belongings or valuables.  Notify your doctor if there is any change in your medical condition (cold, fever, infection).  Wear comfortable clothing (specific to your surgery type) to the hospital.  After surgery, you can help prevent lung complications by doing breathing exercises.  Take deep breaths and cough every 1-2 hours. Your doctor may order a device called an Incentive Spirometer to help you take deep breaths. When coughing or sneezing, hold a pillow firmly against your incision with both hands. This is called "splinting." Doing this helps protect your incision. It also decreases belly discomfort.  If you are being admitted to the hospital overnight, leave your suitcase in the car. After surgery it may be brought to your room.  If you are being discharged the day of surgery, you will not be allowed to drive home. You will need a responsible adult (18 years or older) to drive you home and stay with you that night.   If you are taking public transportation, you will need to have a responsible adult (18 years or older) with you. Please confirm with your physician that it is acceptable to use  public transportation.   Please call the [place] Dept. at [phone] if you have any questions about these instructions.  Surgery Visitation Policy:  Patients undergoing a surgery or procedure may have two family members or support persons with them as long as the person is not COVID-19 positive or experiencing its symptoms.

## 2021-06-19 ENCOUNTER — Encounter
Admission: RE | Admit: 2021-06-19 | Discharge: 2021-06-19 | Disposition: A | Discharge status: Home / Self Care | Payer: Medicare Other | Source: Ambulatory Visit | Attending: General Surgery | Admitting: General Surgery

## 2021-06-19 DIAGNOSIS — I10 Essential (primary) hypertension: Secondary | ICD-10-CM | POA: Insufficient documentation

## 2021-06-19 DIAGNOSIS — E119 Type 2 diabetes mellitus without complications: Secondary | ICD-10-CM | POA: Insufficient documentation

## 2021-06-19 DIAGNOSIS — Z01818 Encounter for other preprocedural examination: Secondary | ICD-10-CM

## 2021-06-19 DIAGNOSIS — Z01810 Encounter for preprocedural cardiovascular examination: Secondary | ICD-10-CM | POA: Diagnosis present

## 2021-06-22 ENCOUNTER — Other Ambulatory Visit: Payer: Self-pay

## 2021-06-22 ENCOUNTER — Ambulatory Visit: Payer: Medicare Other

## 2021-06-22 ENCOUNTER — Ambulatory Visit: Payer: Medicare Other | Admitting: Anesthesiology

## 2021-06-22 ENCOUNTER — Ambulatory Visit
Admission: RE | Admit: 2021-06-22 | Discharge: 2021-06-22 | Disposition: A | Discharge status: Home / Self Care | Payer: Medicare Other | Source: Ambulatory Visit | Attending: General Surgery | Admitting: General Surgery

## 2021-06-22 ENCOUNTER — Encounter
Admission: RE | Disposition: A | Discharge status: Home / Self Care | Payer: Self-pay | Source: Home / Self Care | Attending: General Surgery

## 2021-06-22 ENCOUNTER — Encounter: Payer: Self-pay | Admitting: General Surgery

## 2021-06-22 ENCOUNTER — Ambulatory Visit
Admission: RE | Admit: 2021-06-22 | Discharge: 2021-06-22 | Disposition: A | Discharge status: Home / Self Care | Payer: Medicare Other | Attending: General Surgery | Admitting: General Surgery

## 2021-06-22 DIAGNOSIS — D0592 Unspecified type of carcinoma in situ of left breast: Secondary | ICD-10-CM | POA: Diagnosis not present

## 2021-06-22 DIAGNOSIS — Z7984 Long term (current) use of oral hypoglycemic drugs: Secondary | ICD-10-CM | POA: Diagnosis not present

## 2021-06-22 DIAGNOSIS — I129 Hypertensive chronic kidney disease with stage 1 through stage 4 chronic kidney disease, or unspecified chronic kidney disease: Secondary | ICD-10-CM | POA: Insufficient documentation

## 2021-06-22 DIAGNOSIS — Z1501 Genetic susceptibility to malignant neoplasm of breast: Secondary | ICD-10-CM | POA: Diagnosis not present

## 2021-06-22 DIAGNOSIS — E1122 Type 2 diabetes mellitus with diabetic chronic kidney disease: Secondary | ICD-10-CM | POA: Diagnosis not present

## 2021-06-22 DIAGNOSIS — C50912 Malignant neoplasm of unspecified site of left female breast: Secondary | ICD-10-CM

## 2021-06-22 DIAGNOSIS — N183 Chronic kidney disease, stage 3 unspecified: Secondary | ICD-10-CM | POA: Diagnosis not present

## 2021-06-22 DIAGNOSIS — C50922 Malignant neoplasm of unspecified site of left male breast: Secondary | ICD-10-CM

## 2021-06-22 DIAGNOSIS — Z1509 Genetic susceptibility to other malignant neoplasm: Secondary | ICD-10-CM | POA: Insufficient documentation

## 2021-06-22 DIAGNOSIS — Z9011 Acquired absence of right breast and nipple: Secondary | ICD-10-CM | POA: Diagnosis not present

## 2021-06-22 DIAGNOSIS — Z170 Estrogen receptor positive status [ER+]: Secondary | ICD-10-CM

## 2021-06-22 DIAGNOSIS — Z01818 Encounter for other preprocedural examination: Secondary | ICD-10-CM

## 2021-06-22 HISTORY — PX: MASTECTOMY W/ SENTINEL NODE BIOPSY: SHX2001

## 2021-06-22 LAB — GLUCOSE, CAPILLARY
Glucose-Capillary: 112 mg/dL — ABNORMAL HIGH (ref 70–99)
Glucose-Capillary: 166 mg/dL — ABNORMAL HIGH (ref 70–99)

## 2021-06-22 SURGERY — MASTECTOMY WITH SENTINEL LYMPH NODE BIOPSY
Anesthesia: General | Site: Breast | Laterality: Left

## 2021-06-22 MED ORDER — OXYCODONE HCL 5 MG/5ML PO SOLN: 5.0000 mg | Freq: Once | ORAL | Status: DC | PRN

## 2021-06-22 MED ORDER — FENTANYL CITRATE (PF) 100 MCG/2ML IJ SOLN
INTRAMUSCULAR | Status: AC
  Filled 2021-06-22: qty 2

## 2021-06-22 MED ORDER — KETAMINE HCL 10 MG/ML IJ SOLN
INTRAMUSCULAR | Status: DC | PRN
  Administered 2021-06-22: 20 mg via INTRAVENOUS
  Administered 2021-06-22: 30 mg via INTRAVENOUS

## 2021-06-22 MED ORDER — 0.9 % SODIUM CHLORIDE (POUR BTL) OPTIME
TOPICAL | Status: DC | PRN
  Administered 2021-06-22: 500 mL

## 2021-06-22 MED ORDER — CEFAZOLIN SODIUM-DEXTROSE 2-4 GM/100ML-% IV SOLN
2.0000 g | INTRAVENOUS | Status: AC
  Administered 2021-06-22: 2 g via INTRAVENOUS

## 2021-06-22 MED ORDER — FENTANYL CITRATE (PF) 100 MCG/2ML IJ SOLN
INTRAMUSCULAR | Status: DC | PRN
  Administered 2021-06-22 (×3): 50 ug via INTRAVENOUS

## 2021-06-22 MED ORDER — CHLORHEXIDINE GLUCONATE 0.12 % MT SOLN
15.0000 mL | Freq: Once | OROMUCOSAL | Status: AC
  Administered 2021-06-22: 15 mL via OROMUCOSAL

## 2021-06-22 MED ORDER — EPHEDRINE SULFATE (PRESSORS) 50 MG/ML IJ SOLN
INTRAMUSCULAR | Status: DC | PRN
  Administered 2021-06-22 (×2): 5 mg via INTRAVENOUS

## 2021-06-22 MED ORDER — ONDANSETRON HCL 4 MG/2ML IJ SOLN
INTRAMUSCULAR | Status: AC
  Filled 2021-06-22: qty 2

## 2021-06-22 MED ORDER — DEXAMETHASONE SODIUM PHOSPHATE 10 MG/ML IJ SOLN
INTRAMUSCULAR | Status: DC | PRN
  Administered 2021-06-22: 10 mg via INTRAVENOUS

## 2021-06-22 MED ORDER — TECHNETIUM TC 99M TILMANOCEPT KIT
1.0600 | PACK | Freq: Once | INTRAVENOUS | Status: AC | PRN
  Administered 2021-06-22: 1.06 via INTRADERMAL

## 2021-06-22 MED ORDER — KETAMINE HCL 50 MG/5ML IJ SOSY
PREFILLED_SYRINGE | INTRAMUSCULAR | Status: AC
  Filled 2021-06-22: qty 5

## 2021-06-22 MED ORDER — METHOCARBAMOL 750 MG PO TABS: 750.0000 mg | ORAL_TABLET | Freq: Three times a day (TID) | ORAL | 0 refills | Status: AC | PRN

## 2021-06-22 MED ORDER — ORAL CARE MOUTH RINSE: 15.0000 mL | Freq: Once | OROMUCOSAL | Status: AC

## 2021-06-22 MED ORDER — LACTATED RINGERS IV SOLN: INTRAVENOUS | Status: DC

## 2021-06-22 MED ORDER — OXYCODONE HCL 5 MG PO TABS: 5.0000 mg | ORAL_TABLET | Freq: Once | ORAL | Status: DC | PRN

## 2021-06-22 MED ORDER — PROPOFOL 10 MG/ML IV BOLUS
INTRAVENOUS | Status: DC | PRN
  Administered 2021-06-22: 140 mg via INTRAVENOUS

## 2021-06-22 MED ORDER — BUPIVACAINE LIPOSOME 1.3 % IJ SUSP
INTRAMUSCULAR | Status: AC
  Filled 2021-06-22: qty 20

## 2021-06-22 MED ORDER — LIDOCAINE HCL (CARDIAC) PF 100 MG/5ML IV SOSY
PREFILLED_SYRINGE | INTRAVENOUS | Status: DC | PRN
  Administered 2021-06-22: 80 mg via INTRAVENOUS

## 2021-06-22 MED ORDER — MIDAZOLAM HCL 2 MG/2ML IJ SOLN
INTRAMUSCULAR | Status: AC
  Filled 2021-06-22: qty 2

## 2021-06-22 MED ORDER — HYDROCODONE-ACETAMINOPHEN 5-325 MG PO TABS: 1.0000 | ORAL_TABLET | ORAL | 0 refills | Status: AC | PRN

## 2021-06-22 MED ORDER — SODIUM CHLORIDE 0.9 % IV SOLN: INTRAVENOUS | Status: DC

## 2021-06-22 MED ORDER — ONDANSETRON HCL 4 MG/2ML IJ SOLN: 4.0000 mg | Freq: Once | INTRAMUSCULAR | Status: DC | PRN

## 2021-06-22 MED ORDER — SUCCINYLCHOLINE CHLORIDE 200 MG/10ML IV SOSY
PREFILLED_SYRINGE | INTRAVENOUS | Status: AC
  Filled 2021-06-22: qty 10

## 2021-06-22 MED ORDER — SODIUM CHLORIDE (PF) 0.9 % IJ SOLN: INTRAMUSCULAR | Status: DC | PRN

## 2021-06-22 MED ORDER — CHLORHEXIDINE GLUCONATE 0.12 % MT SOLN
OROMUCOSAL | Status: DC
Start: 2021-06-22 — End: 2021-06-22
  Filled 2021-06-22: qty 15

## 2021-06-22 MED ORDER — STERILE WATER FOR IRRIGATION IR SOLN
Status: DC | PRN
  Administered 2021-06-22: 500 mL

## 2021-06-22 MED ORDER — ACETAMINOPHEN 10 MG/ML IV SOLN: 1000.0000 mg | Freq: Once | INTRAVENOUS | Status: DC | PRN

## 2021-06-22 MED ORDER — SUCCINYLCHOLINE CHLORIDE 200 MG/10ML IV SOSY
PREFILLED_SYRINGE | INTRAVENOUS | Status: DC | PRN
  Administered 2021-06-22: 100 mg via INTRAVENOUS

## 2021-06-22 MED ORDER — ONDANSETRON HCL 4 MG/2ML IJ SOLN
INTRAMUSCULAR | Status: DC | PRN
  Administered 2021-06-22: 4 mg via INTRAVENOUS

## 2021-06-22 MED ORDER — BUPIVACAINE-EPINEPHRINE 0.25% -1:200000 IJ SOLN
INTRAMUSCULAR | Status: DC | PRN
  Administered 2021-06-22: 50 mL via SURGICAL_CAVITY

## 2021-06-22 MED ORDER — LIDOCAINE HCL (PF) 2 % IJ SOLN
INTRAMUSCULAR | Status: AC
  Filled 2021-06-22: qty 5

## 2021-06-22 MED ORDER — FENTANYL CITRATE (PF) 100 MCG/2ML IJ SOLN: 25.0000 ug | INTRAMUSCULAR | Status: DC | PRN

## 2021-06-22 MED ORDER — MIDAZOLAM HCL 2 MG/2ML IJ SOLN
INTRAMUSCULAR | Status: DC | PRN
  Administered 2021-06-22: 1 mg via INTRAVENOUS

## 2021-06-22 MED ORDER — PROPOFOL 10 MG/ML IV BOLUS
INTRAVENOUS | Status: AC
  Filled 2021-06-22: qty 20

## 2021-06-22 MED ORDER — CEFAZOLIN SODIUM-DEXTROSE 2-4 GM/100ML-% IV SOLN
INTRAVENOUS | Status: AC
  Filled 2021-06-22: qty 100

## 2021-06-22 MED ORDER — BUPIVACAINE-EPINEPHRINE (PF) 0.25% -1:200000 IJ SOLN
INTRAMUSCULAR | Status: AC
  Filled 2021-06-22: qty 30

## 2021-06-22 SURGICAL SUPPLY — 45 items
ADH SKN CLS APL DERMABOND .7 (GAUZE/BANDAGES/DRESSINGS) ×1
APL PRP STRL LF DISP 70% ISPRP (MISCELLANEOUS) ×1
BNDG ELASTIC 6X5.8 VLCR STR LF (GAUZE/BANDAGES/DRESSINGS) ×1 IMPLANT
BULB RESERV EVAC DRAIN JP 100C (MISCELLANEOUS) ×5 IMPLANT
CHLORAPREP W/TINT 26 (MISCELLANEOUS) ×2 IMPLANT
DERMABOND ADVANCED (GAUZE/BANDAGES/DRESSINGS) ×1
DERMABOND ADVANCED .7 DNX12 (GAUZE/BANDAGES/DRESSINGS) ×1 IMPLANT
DRAIN CHANNEL JP 15F RND 16 (MISCELLANEOUS) ×4 IMPLANT
DRAIN JP 15F RND TROCAR (DRAIN) ×1 IMPLANT
DRAPE LAPAROTOMY TRNSV 106X77 (MISCELLANEOUS) ×2 IMPLANT
DRSG GAUZE FLUFF 36X18 (GAUZE/BANDAGES/DRESSINGS) ×3 IMPLANT
ELECT REM PT RETURN 9FT ADLT (ELECTROSURGICAL) ×2
ELECTRODE REM PT RTRN 9FT ADLT (ELECTROSURGICAL) ×1 IMPLANT
GAUZE 4X4 16PLY ~~LOC~~+RFID DBL (SPONGE) ×2 IMPLANT
GAUZE SPONGE 4X4 12PLY STRL (GAUZE/BANDAGES/DRESSINGS) ×2 IMPLANT
GLOVE BIO SURGEON STRL SZ 6.5 (GLOVE) ×2 IMPLANT
GLOVE BIOGEL PI IND STRL 6.5 (GLOVE) ×1 IMPLANT
GLOVE BIOGEL PI INDICATOR 6.5 (GLOVE) ×1
GOWN STRL REUS W/ TWL LRG LVL3 (GOWN DISPOSABLE) ×2 IMPLANT
GOWN STRL REUS W/TWL LRG LVL3 (GOWN DISPOSABLE) ×4
KIT TURNOVER KIT A (KITS) ×2 IMPLANT
LABEL OR SOLS (LABEL) ×2 IMPLANT
MANIFOLD NEPTUNE II (INSTRUMENTS) ×2 IMPLANT
MARGIN MAP 10MM (MISCELLANEOUS) ×2 IMPLANT
PACK BASIN MAJOR ARMC (MISCELLANEOUS) ×2 IMPLANT
PAD ABD DERMACEA PRESS 5X9 (GAUZE/BANDAGES/DRESSINGS) ×2 IMPLANT
SLEVE PROBE SENORX GAMMA FIND (MISCELLANEOUS) ×2 IMPLANT
SPONGE T-LAP 18X18 ~~LOC~~+RFID (SPONGE) ×4 IMPLANT
SUT ETHILON 3-0 FS-10 30 BLK (SUTURE) ×4
SUT ETHILON 4-0 (SUTURE)
SUT ETHILON 4-0 FS2 18XMFL BLK (SUTURE)
SUT MNCRL 4-0 (SUTURE) ×4
SUT MNCRL 4-0 27XMFL (SUTURE) ×2
SUT SILK 2 0 SH (SUTURE) IMPLANT
SUT SILK 3-0 (SUTURE) ×2 IMPLANT
SUT VIC AB 3-0 54X BRD REEL (SUTURE) ×1 IMPLANT
SUT VIC AB 3-0 BRD 54 (SUTURE) ×2
SUT VIC AB 3-0 SH 27 (SUTURE) ×4
SUT VIC AB 3-0 SH 27X BRD (SUTURE) ×2 IMPLANT
SUT VIC AB 3-0 SH 8-18 (SUTURE) ×2 IMPLANT
SUT VICRYL 3-0 CR8 SH (SUTURE) ×1 IMPLANT
SUTURE EHLN 3-0 FS-10 30 BLK (SUTURE) ×1 IMPLANT
SUTURE ETHLN 4-0 FS2 18XMF BLK (SUTURE) IMPLANT
SUTURE MNCRL 4-0 27XMF (SUTURE) ×2 IMPLANT
WATER STERILE IRR 500ML POUR (IV SOLUTION) ×2 IMPLANT

## 2021-06-22 NOTE — Interval H&P Note (Signed)
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 Breast cancer, BRCA2 positive, left CMS-HCC C50.912, Z15.01.  The various methods of treatment have been discussed with the patient and family. After consideration of risks, benefits and other options for treatment, the patient has consented to  Procedure(s): MASTECTOMY WITH SENTINEL LYMPH NODE BIOPSY (Left) as a surgical intervention.  The patient's history has been reviewed, patient examined, no change in status, stable for surgery.  I have reviewed the patient's chart and labs.  Questions were answered to the patient's satisfaction.     Edgardo Cintron-Diaz   

## 2021-06-22 NOTE — OR Nursing (Signed)
Dr. Windell Moment in to see pt in postop 119 pm.

## 2021-06-22 NOTE — Anesthesia Preprocedure Evaluation (Addendum)
Anesthesia Evaluation  Patient identified by MRN, date of birth, ID band Patient awake    Reviewed: Allergy & Precautions, NPO status , Patient's Chart, lab work & pertinent test results  History of Anesthesia Complications Negative for: history of anesthetic complications  Airway Mallampati: I   Neck ROM: Full    Dental  (+) Chipped   Pulmonary former smoker (quit 1970),    Pulmonary exam normal breath sounds clear to auscultation       Cardiovascular hypertension, Normal cardiovascular exam Rhythm:Regular Rate:Normal  ECG 06/19/21: normal   Neuro/Psych  Headaches,    GI/Hepatic GERD  ,  Endo/Other  diabetes, Type 2  Renal/GU Renal disease (stage III CKD, nephrolithiasis)     Musculoskeletal  (+) Arthritis ,   Abdominal   Peds  Hematology negative hematology ROS (+)   Anesthesia Other Findings   Reproductive/Obstetrics                            Anesthesia Physical Anesthesia Plan  ASA: 2  Anesthesia Plan: General   Post-op Pain Management:    Induction: Intravenous  PONV Risk Score and Plan: 2 and Ondansetron, Dexamethasone and Treatment may vary due to age or medical condition  Airway Management Planned: LMA  Additional Equipment:   Intra-op Plan:   Post-operative Plan: Extubation in OR  Informed Consent: I have reviewed the patients History and Physical, chart, labs and discussed the procedure including the risks, benefits and alternatives for the proposed anesthesia with the patient or authorized representative who has indicated his/her understanding and acceptance.     Dental advisory given  Plan Discussed with: CRNA  Anesthesia Plan Comments: (Patient consented for risks of anesthesia including but not limited to:  - adverse reactions to medications - damage to eyes, teeth, lips or other oral mucosa - nerve damage due to positioning  - sore throat or  hoarseness - damage to heart, brain, nerves, lungs, other parts of body or loss of life  Informed patient about role of CRNA in peri- and intra-operative care.  Patient voiced understanding.)        Anesthesia Quick Evaluation

## 2021-06-22 NOTE — Transfer of Care (Signed)
Immediate Anesthesia Transfer of Care Note  Patient: Angel Salazar  Procedure(s) Performed: MASTECTOMY WITH SENTINEL LYMPH NODE BIOPSY (Left: Breast)  Patient Location: PACU  Anesthesia Type:General  Level of Consciousness: awake and alert   Airway & Oxygen Therapy: Patient Spontanous Breathing and Patient connected to face mask oxygen  Post-op Assessment: Report given to RN and Post -op Vital signs reviewed and stable  Post vital signs: Reviewed and stable  Last Vitals:  Vitals Value Taken Time  BP 135/73 06/22/21 1230  Temp 36.1 C 06/22/21 1229  Pulse 76 06/22/21 1231  Resp 13 06/22/21 1231  SpO2 100 % 06/22/21 1231  Vitals shown include unvalidated device data.  Last Pain:  Vitals:   06/22/21 1229  PainSc: Asleep         Complications: No notable events documented.

## 2021-06-22 NOTE — Anesthesia Procedure Notes (Signed)
Procedure Name: Intubation Date/Time: 06/22/2021 10:11 AM  Performed by: Fredderick Phenix, CRNAPre-anesthesia Checklist: Patient identified, Emergency Drugs available, Suction available and Patient being monitored Patient Re-evaluated:Patient Re-evaluated prior to induction Oxygen Delivery Method: Circle system utilized Preoxygenation: Pre-oxygenation with 100% oxygen Induction Type: IV induction Ventilation: Mask ventilation without difficulty Laryngoscope Size: McGraph and 4 Grade View: Grade I Tube type: Oral Tube size: 7.0 mm Number of attempts: 1 Airway Equipment and Method: Stylet and Oral airway Placement Confirmation: ETT inserted through vocal cords under direct vision, positive ETCO2 and breath sounds checked- equal and bilateral Secured at: 21 cm Tube secured with: Tape Dental Injury: Teeth and Oropharynx as per pre-operative assessment

## 2021-06-22 NOTE — Discharge Instructions (Addendum)
  Diet: Resume home heart healthy regular diet.   Activity: No heavy lifting >20 pounds (children, pets, laundry, garbage) or strenuous activity until follow-up, but light activity and walking are encouraged. Do not drive or drink alcohol if taking narcotic pain medications.  Wound care: May shower with soapy water and pat dry (do not rub incisions), but no baths or submerging incision underwater until follow-up. (no swimming)   Measure and chart drain output daily.  Medications: Resume all home medications. For mild to moderate pain: acetaminophen (Tylenol) or ibuprofen (if no kidney disease). Combining Tylenol with alcohol can substantially increase your risk of causing liver disease. Narcotic pain medications, if prescribed, can be used for severe pain, though may cause nausea, constipation, and drowsiness. Do not combine Tylenol and Norco within a 6 hour period as Norco contains Tylenol. If you do not need the narcotic pain medication, you do not need to fill the prescription.  Robaxin can cause significant drowsiness. Do not drive or do any activity that requires concentration if taking the muscle relaxer.   Call office [phone]) at any time if any questions, worsening pain, fevers/chills, bleeding, drainage from incision site, or other concerns.    AMBULATORY SURGERY  DISCHARGE INSTRUCTIONS   The drugs that you were given will stay in your system until tomorrow so for the next 24 hours you should not:  Drive an automobile Make any legal decisions Drink any alcoholic beverage   You may resume regular meals tomorrow.  Today it is better to start with liquids and gradually work up to solid foods.  You may eat anything you prefer, but it is better to start with liquids, then soup and crackers, and gradually work up to solid foods.   Please notify your doctor immediately if you have any unusual bleeding, trouble breathing, redness and pain at the surgery site, drainage, fever,  or pain not relieved by medication.    Additional Instructions:        Please contact your physician with any problems or Same Day Surgery at [phone], Monday through Friday 6 am to 4 pm, or [place] at [facility] number at [phone].     JP Drain [employer] this sheet to all of your post-operative appointments while you have your drains. Please measure your drains by CC's or ML's. Make sure you drain and measure your JP Drains 3 times per day. At the end of each day, add up totals for the left side and add up totals for the right side.    ( 9 am )     ( 3 pm )        ( 9 pm )                Date L  R  L  R  L  R  Total L/R

## 2021-06-22 NOTE — Anesthesia Postprocedure Evaluation (Signed)
Anesthesia Post Note  Patient: Angel Salazar  Procedure(s) Performed: MASTECTOMY WITH SENTINEL LYMPH NODE BIOPSY (Left: Breast)  Patient location during evaluation: PACU Anesthesia Type: General Level of consciousness: awake and alert, oriented and patient cooperative Pain management: pain level controlled Vital Signs Assessment: post-procedure vital signs reviewed and stable Respiratory status: spontaneous breathing, nonlabored ventilation and respiratory function stable Cardiovascular status: blood pressure returned to baseline and stable Postop Assessment: adequate PO intake Anesthetic complications: no   No notable events documented.   Last Vitals:  Vitals:   06/22/21 1319 06/22/21 1341  BP: (!) 142/85 138/82  Pulse: 75 72  Resp: 16 16  Temp: (!) 36.4 C   SpO2: 100% 100%    Last Pain:  Vitals:   06/22/21 1341  TempSrc:   PainSc: 0-No pain                 Darrin Nipper

## 2021-06-22 NOTE — Op Note (Signed)
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  Postoperative diagnosis: Same.   Procedure: Left total mastectomy.                     Sentinel Lymph node biopsy  Anesthesia: GETA  Surgeon: Dr. Windell Moment  Wound Classification: Clean  Indications: Patient is a 81 y.o. male who had an abnormal mammogram that on workup with core needle biopsy was found to be invasive mammary carcinoma. After discussion of alternatives, the patient elected total mastectomy and sentinel lymph node biopsy.  Findings: Palpable mass at 3 o'cloc of the left breast.  No other gross abnormality.   Description of procedure: The patient was brought to the operating room and general anesthesia was induced. A time-out was completed verifying correct patient, procedure, site, positioning, and implant(s) and/or special equipment prior to beginning this procedure. The breast, chest wall, axilla, and upper arm and neck were prepped and draped in the usual sterile fashion.  A skin incision was made that encompassed the nipple-areola complex and the previous biopsy scar and passed in an oblique direction across the breast. Flaps were raised in the avascular plane between subcutaneous tissue and breast tissue from the clavicle superiorly, the sternum medially, the anterior rectus sheath inferiorly, and past the lateral border of the pectoralis major muscle laterally. Hemostasis was achieved in the flaps. Next, the breast tissue and underlying pectoralis fascia were excised from the pectoralis major muscle, progressing from medially to laterally. At the lateral border of the pectoralis major muscle, the breast tissue was swung laterally and a lateral pedicle identified where breast tissue gave way to fat of axilla. The lateral pedicle was incised and the specimen removed.  A hand-held gamma probe was used to identify the location of the hottest spot in the axilla.  Dissection was carried down until subdermal  facias was advanced. The probe was placed and again, the point of maximal count was found. Dissection continue until nodule was identified. The probe was placed in contact with the node. The node was excised in its entirety.  No additional hot spots were identified. No clinically abnormal nodes were palpated. The procedure was terminated.  The wound was irrigated and hemostasis was achieved. Closed suction drains were brought into the operative field through a separate stab incision and sutured to the skin with a 3-0 nylon suture. The wound was closed with interrupted 3-0 Vicryl to the subcutaneous layer, followed by a subcuticular layer of Monocryl 4-0. The wound was dressed.  The patient tolerated the procedure well and was taken to the postanesthesia care unit in stable condition.     Sentinel Node Biopsy Synoptic Operative Report  Operation performed with curative intent:Yes  Tracer(s) used to identify sentinel nodes in the upfront surgery (non-neoadjuvant) setting (select all that apply):Radioactive Tracer  Tracer(s) used to identify sentinel nodes in the neoadjuvant setting (select all that apply):N/A  All nodes (colored or non-colored) present at the end of a dye-filled lymphatic channel were removed:N/A  All significantly radioactive nodes were removed:Yes  All palpable suspicious nodes were removed:N/A  Biopsy-proven positive nodes marked with clips prior to chemotherapy were identified and removed:N/A  Specimen: Left Breast                    Left Axillary Sentinel lymph node.   Complications: None  Estimated Blood Loss: 50 mL

## 2021-06-23 ENCOUNTER — Encounter: Payer: Self-pay | Admitting: General Surgery

## 2021-06-25 ENCOUNTER — Other Ambulatory Visit: Payer: Self-pay | Admitting: Anatomic Pathology & Clinical Pathology

## 2021-06-25 ENCOUNTER — Encounter: Payer: Self-pay | Admitting: *Deleted

## 2021-06-25 LAB — SURGICAL PATHOLOGY

## 2021-06-25 NOTE — Progress Notes (Signed)
Called patient to see how he is doing since his mastectomy on Friday.  Patient feeling well, no complaints.   Follow up given for Dr. Tasia Catchings on 07/04/21.   Patient knows to call with any questions or concerns.

## 2021-07-02 ENCOUNTER — Telehealth: Payer: Self-pay | Admitting: *Deleted

## 2021-07-04 ENCOUNTER — Encounter: Payer: Self-pay | Admitting: *Deleted

## 2021-07-04 ENCOUNTER — Inpatient Hospital Stay: Payer: Medicare Other | Admitting: Oncology

## 2021-07-04 NOTE — Progress Notes (Signed)
Prior auth form for oncotype Dx faxed to [facility]

## 2021-07-05 ENCOUNTER — Encounter: Payer: Self-pay | Admitting: *Deleted

## 2021-07-05 NOTE — Progress Notes (Signed)
Received a call from [employer] to see if pathology specimen had been sent out yet, Per pathology it was sent yesterday 07/04/21.  Tracking #[id number].  Returned call to [employer] and tracking info given.

## 2021-07-09 ENCOUNTER — Telehealth: Payer: Self-pay | Admitting: Oncology

## 2021-07-09 NOTE — Telephone Encounter (Signed)
Patient called to r/s his appointment on 5/83 due to a conflict with another appointment. Patient expressed frustration about nowt knowing test results and would like a return phone call to explain (he mentioned that there was a "last minute add on"). He has requested to come in on 7/10 but Dr. Tasia Catchings is overbooked this day.   Please advise on how to move forward.   Thanks!

## 2021-07-09 NOTE — Telephone Encounter (Signed)
Just FYI, oncotype testing results still pending. Waiting on PA response.

## 2021-07-11 ENCOUNTER — Inpatient Hospital Stay: Payer: Medicare Other | Attending: Oncology | Admitting: Hospice and Palliative Medicine

## 2021-07-11 ENCOUNTER — Encounter: Payer: Self-pay | Admitting: *Deleted

## 2021-07-11 ENCOUNTER — Telehealth: Payer: Self-pay

## 2021-07-11 DIAGNOSIS — Z87891 Personal history of nicotine dependence: Secondary | ICD-10-CM | POA: Insufficient documentation

## 2021-07-11 DIAGNOSIS — C50929 Malignant neoplasm of unspecified site of unspecified male breast: Secondary | ICD-10-CM

## 2021-07-11 DIAGNOSIS — C50822 Malignant neoplasm of overlapping sites of left male breast: Secondary | ICD-10-CM | POA: Insufficient documentation

## 2021-07-11 NOTE — Progress Notes (Signed)
Multidisciplinary Oncology Council Documentation  Angel Salazar was presented by our [facility] on 07/11/2021, which included representatives from:  Palliative Care Dietitian  Physical/Occupational Therapist Nurse Navigator Genetics Speech Therapist Social work Survivorship RN Financial Navigator Research RN   Angel Salazar currently presents with history of breast cancer  We reviewed previous medical and familial history, history of present illness, and recent lab results along with all available histopathologic and imaging studies. The [place] considered available treatment options and made the following recommendations/referrals:  Rehab screening  The MOC is a meeting of clinicians from various specialty areas who evaluate and discuss patients for whom a multidisciplinary approach is being considered. Final determinations in the plan of care are those of the provider(s).   Today's extended care, comprehensive team conference, Angel Salazar was not present for the discussion and was not examined.

## 2021-07-11 NOTE — Telephone Encounter (Signed)
I believe Angel Salazar has initiated the auth?

## 2021-07-11 NOTE — Progress Notes (Addendum)
I called [employer] checking on status of prior auth for oncotype dx testing.  Per [employer] they had not received the prior auth request.  A new request has been initiated, all clinicals have also been faxed.

## 2021-07-11 NOTE — Telephone Encounter (Signed)
Dawn calling stating that there needs to be an authorization initiated for this patient she states that this is the 3rd time following up on this and she will fax over the instructions again today. You can reach her at [phone] option 2  *I could not understand what she said when she stated where she was calling from*

## 2021-07-13 ENCOUNTER — Telehealth: Payer: Self-pay | Admitting: *Deleted

## 2021-07-13 NOTE — Telephone Encounter (Signed)
Received incoming [name] referral for OT Screen to see Angel Salazar. I contacted the patient. Discussed referral with the patient. Pt has h/o left breast cancer. Pt agreeable to referral. Apt made for 07/18/21 at 10 am. (Note: Pt has an apt at 8 am to see his surgeon.)

## 2021-07-17 ENCOUNTER — Ambulatory Visit: Payer: Medicare Other | Admitting: Oncology

## 2021-07-18 ENCOUNTER — Inpatient Hospital Stay: Payer: Medicare Other | Admitting: Occupational Therapy

## 2021-07-18 DIAGNOSIS — L905 Scar conditions and fibrosis of skin: Secondary | ICD-10-CM

## 2021-07-18 DIAGNOSIS — M25612 Stiffness of left shoulder, not elsewhere classified: Secondary | ICD-10-CM

## 2021-07-18 NOTE — Therapy (Signed)
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    Breast cancer ([place])    Chronic kidney disease    Diabetes mellitus without complication ([place])    GERD (gastroesophageal reflux disease)    Headache    migraines   Heart murmur    History of kidney stones    Hypertension     Past Surgical History:  Procedure Laterality Date   BREAST BIOPSY Left 06/06/2021   CHOLECYSTECTOMY     COLONOSCOPY     FRACTURE SURGERY     skull fx from [place] Right 2009   MASTECTOMY W/ SENTINEL NODE BIOPSY Left 06/22/2021   Procedure: MASTECTOMY WITH SENTINEL LYMPH NODE BIOPSY;  Surgeon: Herbert Pun, MD;  Location: [facility] ORS;  Service: General;  Laterality: Left;   MOHS SURGERY     TONSILLECTOMY     age 81    There were no vitals filed for this visit.   Subjective Assessment - 07/18/21 1830     Subjective  I am doing okay.  I discomfort or pain type of visit 3/10 during the day.  It is about the same at night he just feels little worse because of quiet.  I had a right mastectomy in the past.    Currently in Pain? 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 Drain draining less than 20 cc/day  -Drain removed today -We will follow with physical exam in 2 weeks -Patient has a medical oncology appointment for discussion of adjuvant therapies  PLAN:  1. Continue the great care you are doing 2. I will see you in 2 weeks for wound follow-up     OT SCREEN 07/18/21: Patient present this date postop left total mastectomy on 06/22/2021.  Patient reports some discomfort or pain mostly at night 3/10 with tenderness over the scar.  Patient is active range of motion left shoulder within functional limits but endrange limited with some discomfort at scar. Patient was educated  on scar mobilization and scar massage with arm overhead. As well as home program for shoulder flexion and abduction using a wand in supine.  Can do 2 times a day for 10-12 reps keeping pain under 2/10. Circumference in bilateral upper extremities was within normal limits.  Patient reports years ago he had the same surgery on the right side did not had any issues.  Patient aware of signs of symptoms of swelling or lymphedema to watch out for risk low.  Patient can return to prior level of function gradually.                                  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

## 2021-07-19 ENCOUNTER — Encounter: Payer: Self-pay | Admitting: Oncology

## 2021-07-20 ENCOUNTER — Encounter: Payer: Self-pay | Admitting: *Deleted

## 2021-07-20 NOTE — Progress Notes (Signed)
Angel Salazar came to the lobby asking for his oncotype results.   Results given to the patient.   He is upset that he hasn't been started on Tamoxifen yet and didn't want to wait until 7/26 to see Dr. Tasia Catchings.   MD was not in office today, so I spoke with her nurse Angel Salazar.   Angel Salazar said we can move his appt. Up to 7/19.   Patient given new appt for 7/19 at 2:15.

## 2021-07-25 ENCOUNTER — Encounter: Payer: Self-pay | Admitting: Oncology

## 2021-07-25 ENCOUNTER — Inpatient Hospital Stay: Payer: Medicare Other | Admitting: Oncology

## 2021-07-25 VITALS — BP 140/79 | HR 85 | Temp 96.8°F | Resp 18 | Wt 163.4 lb

## 2021-07-25 DIAGNOSIS — C50822 Malignant neoplasm of overlapping sites of left male breast: Secondary | ICD-10-CM | POA: Diagnosis not present

## 2021-07-25 DIAGNOSIS — Z1501 Genetic susceptibility to malignant neoplasm of breast: Secondary | ICD-10-CM

## 2021-07-25 DIAGNOSIS — Z1503 Genetic susceptibility to malignant neoplasm of prostate: Secondary | ICD-10-CM | POA: Diagnosis not present

## 2021-07-25 DIAGNOSIS — Z1509 Genetic susceptibility to other malignant neoplasm: Secondary | ICD-10-CM

## 2021-07-25 DIAGNOSIS — Z87891 Personal history of nicotine dependence: Secondary | ICD-10-CM | POA: Diagnosis not present

## 2021-07-25 DIAGNOSIS — C50919 Malignant neoplasm of unspecified site of unspecified female breast: Secondary | ICD-10-CM

## 2021-07-25 DIAGNOSIS — C50922 Malignant neoplasm of unspecified site of left male breast: Secondary | ICD-10-CM

## 2021-07-25 NOTE — Progress Notes (Signed)
Patient here for follow up. Pt concerned that he has not been started on Tamoxifen

## 2021-07-25 NOTE — Assessment & Plan Note (Addendum)
S/p left mastectomy and SLNB.  pT1c N0 Pathology results were reviewed.  Oncotype Dx recurrence score was 23, chemotherapy benefit <1%.  There are limited data regarding the indications for postmastectomy RT in men treated for breast cancer.  For T1 tumor size [not T3 and T4 disease], negative lymph node, survival benefit of postmastectomy radiation in male breast cancer is unclear.  His case was discussed on tumor board and Dr. Baruch Gouty recommends radiation oncology evaluation due to the proximity to chest wall. Tumor board recommendation was discussed. Patient and daughter have multiple questions regarding radiation, and I was not able to answer.  I encourage patient to establish care with Dr. Baruch Gouty for further discussion.  Patient was reluctant but eventually agreed with radiation oncology evaluation.  After radiation, or if patient decides not to proceed with radiation, I recommend tamoxifen 20 mg daily with a duration of at least 5 years if tolerable.

## 2021-07-25 NOTE — Progress Notes (Signed)
Hematology/Oncology Progress note [phone] [phone]            Patient Care Team: Derinda Late, MD as PCP - General (Family Medicine)  REFERRING PROVIDER: Derinda Late, MD  CHIEF COMPLAINTS/REASON FOR VISIT:  Breast cancer  HISTORY OF PRESENTING ILLNESS:   Angel Salazar is a  81 y.o.  male with PMH listed below was seen in consultation at the request of  Derinda Late, MD  for evaluation of breast cancer.  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  Patient took 5 years of tamoxifen.  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  Oncology History  Invasive carcinoma of breast ([place])  05/22/2021 Mammogram   Left breast mammogram showed 3 o'clock location of the LEFT breast. Ultrasound left breast showed mass is estimated to measure 1 x 0.3 x 0.7 cm.  Left axilla is negative for adenopathy.   06/06/2021 Cancer Staging   Staging form: Breast, AJCC 8th Edition - Clinical stage from 06/06/2021: Stage IA (cT1b, cN0, cM0, G2, ER+, PR+, HER2-) - Signed by Earlie Server, MD on 06/16/2021 Stage prefix: Initial diagnosis Histologic grading system: 3 grade system HER2-IHC interpretation: Equivocal HER2-IHC value: Score 2+ HER2-FISH interpretation: Negative   06/16/2021 Initial Diagnosis   Invasive carcinoma of breast ([place])    Genetic Testing   patient reports that he has had genetic testing done in the past.  He has BRCA2 mutation    06/22/2021 Surgery   Patient underwent left simple mastectomy and sentinel lymph node biopsy.  Pathology showed invasive mammary carcinoma,grade 3,  no special type, DCIS, one lymph node negative for malignancy. All margins are negative for invasive carcinoma and DCIS.  Oncotype Dx Recurrence Score 23, chemotherapy benefit <1%       Patient reports feeling well today, no concerns of surgery site. He is not happy that he has to wait on Oncotype Dx testing and has  not been started on Tamoxifen yet. Patient . Daughter was called during this encounter   Review of Systems  Constitutional:  Negative for appetite change, chills, fatigue, fever and unexpected weight change.  HENT:   Negative for hearing loss and voice change.   Eyes:  Negative for eye problems and icterus.  Respiratory:  Negative for chest tightness, cough and shortness of breath.   Cardiovascular:  Negative for chest pain and leg swelling.  Gastrointestinal:  Negative for abdominal distention and abdominal pain.  Endocrine: Negative for hot flashes.  Genitourinary:  Negative for difficulty urinating, dysuria and frequency.   Musculoskeletal:  Negative for arthralgias.  Skin:  Negative for itching and rash.  Neurological:  Negative for light-headedness and numbness.  Hematological:  Negative for adenopathy. Does not bruise/bleed easily.  Psychiatric/Behavioral:  Negative for confusion.     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    Breast cancer ([place])    Chronic kidney disease    Diabetes mellitus without complication (HCC)    GERD (gastroesophageal reflux disease)    Headache    migraines   Heart murmur    History of kidney stones    Hypertension     SURGICAL HISTORY: Past Surgical History:  Procedure Laterality Date   BREAST BIOPSY Left 06/06/2021   CHOLECYSTECTOMY     COLONOSCOPY     FRACTURE SURGERY     skull fx from [place]  MASTECTOMY Right 2009   MASTECTOMY W/ SENTINEL NODE BIOPSY Left 06/22/2021   Procedure: MASTECTOMY WITH SENTINEL LYMPH NODE BIOPSY;  Surgeon: Herbert Pun, MD;  Location: [facility] ORS;  Service: General;  Laterality: Left;   MOHS SURGERY     TONSILLECTOMY     age 52    SOCIAL HISTORY: Social History   Socioeconomic History   Marital status: Divorced    Spouse name: Not on file   Number of children: Not on file   Years of education: Not on file    Highest education level: Not on file  Occupational History   Not on file  Tobacco Use   Smoking status: Former    Years: 12.00    Types: Cigarettes    Quit date: 1970    Years since quitting: 53.5   Smokeless tobacco: Never  Vaping Use   Vaping Use: Not on file  Substance and Sexual Activity   Alcohol use: Yes    Comment: wine daily   Drug use: Never   Sexual activity: Not Currently    Birth control/protection: None  Other Topics Concern   Not on file  Social History Narrative   Not on file   Social Determinants of Health   Financial Resource Strain: Not on file  Food Insecurity: Not on file  Transportation Needs: Not on file  Physical Activity: Not on file  Stress: Not on file  Social Connections: Not on file  Intimate Partner Violence: Not on file    FAMILY HISTORY: History reviewed. No pertinent family history.  ALLERGIES:  has No Known Allergies.  MEDICATIONS:  Current Outpatient Medications  Medication Sig Dispense Refill   acetaminophen (TYLENOL) 650 MG CR tablet Take 1,300 mg by mouth every 8 (eight) hours as needed for pain.     Alpha-Lipoic Acid 600 MG TABS Take 600 mg by mouth daily.     aspirin EC 81 MG tablet Take 81 mg by mouth daily. Swallow whole.     carboxymethylcellulose (REFRESH PLUS) 0.5 % SOLN Place 1 drop into both eyes daily as needed (dry eyes).     Cholecalciferol (VITAMIN D) 125 MCG (5000 UT) CAPS Take 5,000 Units by mouth daily.     Coenzyme Q10 (COQ-10) 400 MG CAPS Take 400 mg by mouth daily.     enalapril (VASOTEC) 10 MG tablet Take 10 mg by mouth every evening.     finasteride (PROSCAR) 5 MG tablet Take 5 mg by mouth every evening.     folic acid (FOLVITE) 196 MCG tablet Take 800 mcg by mouth daily.     glucosamine-chondroitin 500-400 MG tablet Take 3 tablets by mouth daily.     LUTEIN PO Take 1 tablet by mouth daily.     metFORMIN (GLUCOPHAGE-XR) 500 MG 24 hr tablet Take 500 mg by mouth daily.     methocarbamol (ROBAXIN-750) 750 MG  tablet Take 1 tablet (750 mg total) by mouth every 8 (eight) hours as needed for muscle spasms. 20 tablet 0   Multiple Vitamin (MULTIVITAMIN) capsule Take 1 capsule by mouth daily.     Omega 3-6-9 Fatty Acids (TRIPLE OMEGA-3-6-9) CAPS Take 3 capsules by mouth daily at 6 (six) AM.     omeprazole (PRILOSEC) 40 MG capsule Take 40 mg by mouth every evening.     Probiotic Product (PROBIOTIC-10 ULTIMATE) CAPS Take 1 capsule by mouth daily.     psyllium (METAMUCIL) 58.6 % powder Take 1 packet by mouth daily.     rosuvastatin (CRESTOR) 10 MG  tablet Take 10 mg by mouth at bedtime.     saline (AYR) GEL Place 1 application  into both nostrils every evening.     TURMERIC CURCUMIN PO Take 1,750 mg by mouth daily.     vitamin B-12 (CYANOCOBALAMIN) 1000 MCG tablet Take 1,000 mcg by mouth daily.     No current facility-administered medications for this visit.     PHYSICAL EXAMINATION: ECOG PERFORMANCE STATUS: 0 - Asymptomatic Vitals:   07/25/21 1442  BP: 140/79  Pulse: 85  Resp: 18  Temp: (!) 96.8 F (36 C)   Filed Weights   07/25/21 1442  Weight: 163 lb 6.4 oz (74.1 kg)    Physical Exam Constitutional:      General: He is not in acute distress. HENT:     Head: Normocephalic and atraumatic.  Eyes:     General: No scleral icterus. Cardiovascular:     Rate and Rhythm: Normal rate.  Pulmonary:     Effort: Pulmonary effort is normal. No respiratory distress.  Abdominal:     General: There is no distension.  Musculoskeletal:        General: No deformity. Normal range of motion.     Cervical back: Normal range of motion and neck supple.  Skin:    Findings: No erythema or rash.  Neurological:     Mental Status: He is alert and oriented to person, place, and time. Mental status is at baseline.  Psychiatric:        Mood and Affect: Mood normal.      LABORATORY DATA:  I have reviewed the data as listed    Latest Ref Rng & Units 06/15/2021   10:46 AM  CBC  WBC 4.0 - 10.5 K/uL 6.0    Hemoglobin 13.0 - 17.0 g/dL 14.6   Hematocrit 39.0 - 52.0 % 42.6   Platelets 150 - 400 K/uL 153       Latest Ref Rng & Units 06/15/2021   10:46 AM  CMP  Glucose 70 - 99 mg/dL 122   BUN 8 - 23 mg/dL 23   Creatinine 0.61 - 1.24 mg/dL 1.23   Sodium 135 - 145 mmol/L 137   Potassium 3.5 - 5.1 mmol/L 3.9   Chloride 98 - 111 mmol/L 102   CO2 22 - 32 mmol/L 26   Calcium 8.9 - 10.3 mg/dL 9.3   Total Protein 6.5 - 8.1 g/dL 7.3   Total Bilirubin 0.3 - 1.2 mg/dL 1.4   Alkaline Phos 38 - 126 U/L 25   AST 15 - 41 U/L 25   ALT 0 - 44 U/L 21      Iron/TIBC/Ferritin/ %Sat No results found for: "IRON", "TIBC", "FERRITIN", "IRONPCTSAT"    RADIOGRAPHIC STUDIES: I have personally reviewed the radiological images as listed and agreed with the findings in the report. No results found.    ASSESSMENT & PLAN:   Cancer Staging  Invasive carcinoma of breast ([place]) Staging form: Breast, AJCC 8th Edition - Pathologic stage from 06/22/2021: Stage IA (pT1c, pN0, cM0, G3, ER+, PR+, HER2-, Oncotype DX score: 23) - Signed by Earlie Server, MD on 07/25/2021   Invasive carcinoma of breast ([place]) S/p left mastectomy and SLNB.  pT1c N0 Pathology results were reviewed.  Oncotype Dx recurrence score was 23, chemotherapy benefit <1%.  There are limited data regarding the indications for postmastectomy RT in men treated for breast cancer.  For T1 tumor size [not T3 and T4 disease], negative lymph node, survival benefit of postmastectomy radiation in  male breast cancer is unclear.  His case was discussed on tumor board and Dr. Baruch Gouty recommends radiation oncology evaluation due to the proximity to chest wall. Tumor board recommendation was discussed. Patient and daughter have multiple questions regarding radiation, and I was not able to answer.  I encourage patient to establish care with Dr. Baruch Gouty for further discussion.  Patient was reluctant but eventually agreed with radiation oncology evaluation.  After  radiation, or if patient decides not to proceed with radiation, I recommend tamoxifen 20 mg daily with a duration of at least 5 years if tolerable.  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     Orders Placed This Encounter  Procedures   Ambulatory referral to Radiation Oncology    Referral Priority:   Routine    Referral Type:   Consultation    Referral Reason:   Specialty Services Required    Requested Specialty:   Radiation Oncology    Number of Visits Requested:   1   Ambulatory referral to Genetics    Referral Priority:   Routine    Referral Type:   Consultation    Referral Reason:   Specialty Services Required    Number of Visits Requested:   1    All questions were answered. The patient knows to call the clinic with any problems questions or concerns.  cc Derinda Late, MD   We spent sufficient time to discuss many aspect of care, questions were answered to patient's satisfaction. A total of 40 minutes was spent on this visit.  With 10 minutes spent reviewing pathology reports, 25 minutes counseling the patient on the diagnosis, goal of care, recommendation of tumor board, patient any current therapy.  Additional 5 minutes was spent on answering patient's questions.   Earlie Server, MD, PhD [facility] Hematology Oncology 07/25/2021

## 2021-07-25 NOTE — Assessment & Plan Note (Signed)
Refer to genetic counseling for further discussion on need of additional testing.

## 2021-07-30 ENCOUNTER — Other Ambulatory Visit: Payer: Self-pay | Admitting: Oncology

## 2021-07-30 ENCOUNTER — Ambulatory Visit
Admission: RE | Admit: 2021-07-30 | Discharge: 2021-07-30 | Disposition: A | Discharge status: Home / Self Care | Payer: Medicare Other | Source: Ambulatory Visit | Attending: Radiation Oncology | Admitting: Radiation Oncology

## 2021-07-30 ENCOUNTER — Encounter: Payer: Self-pay | Admitting: Radiation Oncology

## 2021-07-30 ENCOUNTER — Telehealth: Payer: Self-pay | Admitting: Oncology

## 2021-07-30 VITALS — BP 139/80 | HR 76 | Temp 98.4°F | Resp 16 | Wt 161.2 lb

## 2021-07-30 DIAGNOSIS — Z7984 Long term (current) use of oral hypoglycemic drugs: Secondary | ICD-10-CM | POA: Insufficient documentation

## 2021-07-30 DIAGNOSIS — I129 Hypertensive chronic kidney disease with stage 1 through stage 4 chronic kidney disease, or unspecified chronic kidney disease: Secondary | ICD-10-CM | POA: Insufficient documentation

## 2021-07-30 DIAGNOSIS — E1122 Type 2 diabetes mellitus with diabetic chronic kidney disease: Secondary | ICD-10-CM | POA: Insufficient documentation

## 2021-07-30 DIAGNOSIS — Z87442 Personal history of urinary calculi: Secondary | ICD-10-CM | POA: Insufficient documentation

## 2021-07-30 DIAGNOSIS — K219 Gastro-esophageal reflux disease without esophagitis: Secondary | ICD-10-CM | POA: Insufficient documentation

## 2021-07-30 DIAGNOSIS — Z79899 Other long term (current) drug therapy: Secondary | ICD-10-CM | POA: Diagnosis not present

## 2021-07-30 DIAGNOSIS — Z170 Estrogen receptor positive status [ER+]: Secondary | ICD-10-CM | POA: Insufficient documentation

## 2021-07-30 DIAGNOSIS — C50422 Malignant neoplasm of upper-outer quadrant of left male breast: Secondary | ICD-10-CM | POA: Diagnosis present

## 2021-07-30 DIAGNOSIS — R011 Cardiac murmur, unspecified: Secondary | ICD-10-CM | POA: Insufficient documentation

## 2021-07-30 DIAGNOSIS — Z7982 Long term (current) use of aspirin: Secondary | ICD-10-CM | POA: Diagnosis not present

## 2021-07-30 DIAGNOSIS — Z1501 Genetic susceptibility to malignant neoplasm of breast: Secondary | ICD-10-CM | POA: Insufficient documentation

## 2021-07-30 DIAGNOSIS — Z87891 Personal history of nicotine dependence: Secondary | ICD-10-CM | POA: Diagnosis not present

## 2021-07-30 DIAGNOSIS — C50919 Malignant neoplasm of unspecified site of unspecified female breast: Secondary | ICD-10-CM

## 2021-07-30 MED ORDER — TAMOXIFEN CITRATE 20 MG PO TABS: 20.0000 mg | ORAL_TABLET | Freq: Every day | ORAL | 1 refills | Status: AC

## 2021-07-30 NOTE — Telephone Encounter (Signed)
Patient informed of Tamoxifen sent to pharmacy. Advised to follow up with Dr. Tasia Catchings in 3 months. Informed patient I would reach out to genetic counseling as well. Patient verbalized understanding. Patient verbalized understanding.  [place], please schedule patient Lab/MD in 3 months. Please notify patient of appt. Thanks

## 2021-07-30 NOTE — Consult Note (Signed)
NEW PATIENT EVALUATION  Name: Angel Salazar  MRN: [id number]  Date:   07/30/2021     DOB: [birth year]   This 81 y.o. 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  REFERRING PHYSICIAN: Derinda Late, MD  CHIEF COMPLAINT:  Chief Complaint  Patient presents with   Breast Cancer    DIAGNOSIS: The encounter diagnosis was Invasive carcinoma of breast ([place]).   PREVIOUS INVESTIGATIONS:  Mammogram ultrasound reviewed Pathology reports reviewed Clinical notes reviewed  HPI: Patient is a 81 year old male with a history of right-sided invasive mammary carcinoma status postmastectomy in 2009.  He was on tamoxifen for 5 years no adjuvant treatment.  He did test positive for BRCA 2.  He developed a lesion of his left breast 3 o'clock position measuring 1 x 0.3 x 0.7 cm on ultrasound left axilla was negative for adenopathy.  Patient underwent left modified radical mastectomy for a 1.1 cm grade 3 invasive mammary carcinoma margins were clear at 5 mm.  1 sentinel lymph node was negative for metastatic disease.  Tumor was ER/PR positive HER2/neu not overexpressed.  Patient is tolerated her surgery well has been seen by medical oncology and plan for additional endocrine therapy is being made.  He is without complaints.  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    PAST SURGICAL HISTORY:  Past Surgical History:   Procedure Laterality Date   BREAST BIOPSY Left 06/06/2021   CHOLECYSTECTOMY     COLONOSCOPY     FRACTURE SURGERY     skull fx from [place] Right 2009   MASTECTOMY W/ SENTINEL NODE BIOPSY Left 06/22/2021   Procedure: MASTECTOMY WITH SENTINEL LYMPH NODE BIOPSY;  Surgeon: Herbert Pun, MD;  Location: [facility] ORS;  Service: General;  Laterality: Left;   MOHS SURGERY     TONSILLECTOMY     age 6    FAMILY HISTORY: family history is not on file.  SOCIAL HISTORY:  reports that he quit smoking about 53 years ago. His smoking use included cigarettes. He has never used smokeless tobacco. He reports current alcohol use. He reports that he does not use drugs.  ALLERGIES: Patient has no known allergies.  MEDICATIONS:  Current Outpatient Medications  Medication Sig Dispense Refill   acetaminophen (TYLENOL) 650 MG CR tablet Take 1,300 mg by mouth every 8 (eight) hours as needed for pain.     Alpha-Lipoic Acid 600 MG TABS Take 600 mg by mouth daily.     aspirin EC 81 MG tablet Take 81 mg by mouth daily. Swallow whole.     carboxymethylcellulose (REFRESH PLUS) 0.5 % SOLN Place 1 drop into both eyes daily as needed (dry eyes).     Cholecalciferol (VITAMIN D) 125 MCG (5000 UT) CAPS Take 5,000 Units by mouth daily.     Coenzyme Q10 (COQ-10) 400 MG CAPS Take 400 mg by mouth daily.     enalapril (VASOTEC) 10 MG tablet Take 10 mg by mouth every evening.  finasteride (PROSCAR) 5 MG tablet Take 5 mg by mouth every evening.     folic acid (FOLVITE) 299 MCG tablet Take 800 mcg by mouth daily.     glucosamine-chondroitin 500-400 MG tablet Take 3 tablets by mouth daily.     LUTEIN PO Take 1 tablet by mouth daily.     metFORMIN (GLUCOPHAGE-XR) 500 MG 24 hr tablet Take 500 mg by mouth daily.     methocarbamol (ROBAXIN-750) 750 MG tablet Take 1 tablet (750 mg total) by mouth every 8 (eight) hours as needed for muscle spasms. 20 tablet 0   Multiple Vitamin (MULTIVITAMIN)  capsule Take 1 capsule by mouth daily.     Omega 3-6-9 Fatty Acids (TRIPLE OMEGA-3-6-9) CAPS Take 3 capsules by mouth daily at 6 (six) AM.     omeprazole (PRILOSEC) 40 MG capsule Take 40 mg by mouth every evening.     Probiotic Product (PROBIOTIC-10 ULTIMATE) CAPS Take 1 capsule by mouth daily.     psyllium (METAMUCIL) 58.6 % powder Take 1 packet by mouth daily.     rosuvastatin (CRESTOR) 10 MG tablet Take 10 mg by mouth at bedtime.     saline (AYR) GEL Place 1 application  into both nostrils every evening.     TURMERIC CURCUMIN PO Take 1,750 mg by mouth daily.     vitamin B-12 (CYANOCOBALAMIN) 1000 MCG tablet Take 1,000 mcg by mouth daily.     No current facility-administered medications for this encounter.    ECOG PERFORMANCE STATUS:  0 - Asymptomatic  REVIEW OF SYSTEMS: Patient denies any weight loss, fatigue, weakness, fever, chills or night sweats. Patient denies any loss of vision, blurred vision. Patient denies any ringing  of the ears or hearing loss. No irregular heartbeat. Patient denies heart murmur or history of fainting. Patient denies any chest pain or pain radiating to her upper extremities. Patient denies any shortness of breath, difficulty breathing at night, cough or hemoptysis. Patient denies any swelling in the lower legs. Patient denies any nausea vomiting, vomiting of blood, or coffee ground material in the vomitus. Patient denies any stomach pain. Patient states has had normal bowel movements no significant constipation or diarrhea. Patient denies any dysuria, hematuria or significant nocturia. Patient denies any problems walking, swelling in the joints or loss of balance. Patient denies any skin changes, loss of hair or loss of weight. Patient denies any excessive worrying or anxiety or significant depression. Patient denies any problems with insomnia. Patient denies excessive thirst, polyuria, polydipsia. Patient denies any swollen glands, patient denies easy bruising or easy  bleeding. Patient denies any recent infections, allergies or URI. Patient "s visual fields have not changed significantly in recent time.   PHYSICAL EXAM: BP 139/80 (BP Location: Right Arm, Patient Position: Sitting, Cuff Size: Normal)   Pulse 76   Temp 98.4 F (36.9 C) (Tympanic)   Resp 16   Wt 161 lb 3.2 oz (73.1 kg)   BMI 26.02 kg/m  Left modified radical mastectomy scar is healing well.  Right breast also status postmastectomy no evidence of chest wall mass or nodularity is noted no axillary or supraclavicular adenopathy is appreciated.  Well-developed well-nourished patient in NAD. HEENT reveals PERLA, EOMI, discs not visualized.  Oral cavity is clear. No oral mucosal lesions are identified. Neck is clear without evidence of cervical or supraclavicular adenopathy. Lungs are clear to A&P. Cardiac examination is essentially unremarkable with regular rate and rhythm without murmur rub or thrill. Abdomen is benign with no organomegaly or masses  noted. Motor sensory and DTR levels are equal and symmetric in the upper and lower extremities. Cranial nerves II through XII are grossly intact. Proprioception is intact. No peripheral adenopathy or edema is identified. No motor or sensory levels are noted. Crude visual fields are within normal range.  LABORATORY DATA: Pathology reports reviewed    RADIOLOGY RESULTS: Mammograms and ultrasound reviewed   IMPRESSION: Stage Ia ER/PR positive invasive mammary carcinoma of the left breast at a male patient status post left modified radical mastectomy in 81 year old male  PLAN: At this time based on the small tumor clear margins negative sentinel node would not recommend adjuvant radiation therapy to his left chest wall.  I am turning follow-up care over to medical oncology.  We will be happy to reevaluate the patient anytime should that be indicated.  Patient comprehends my recommendations well.  I would like to take this opportunity to thank you for  allowing me to participate in the care of your patient.Noreene Filbert, MD

## 2021-07-30 NOTE — Telephone Encounter (Signed)
Pt came to scheduling desk stating he was done with his radiation and wanted to start his medication again ( Did not state which one) and that no one from genetic counseling has called him for an appt either. I told him I would be in contact with Dr.Yu's team and someone would reach back out to him.

## 2021-08-01 ENCOUNTER — Ambulatory Visit: Payer: Medicare Other | Admitting: Oncology

## 2021-08-02 ENCOUNTER — Encounter: Payer: Self-pay | Admitting: *Deleted

## 2021-08-20 ENCOUNTER — Telehealth: Payer: Self-pay | Admitting: Licensed Clinical Social Worker

## 2021-08-20 NOTE — Telephone Encounter (Signed)
Angel Salazar called with questions about whether genetic counseling was necessary. We discussed the BRCA2 mutation briefly and he reports that his daughters have been tested but his 79 year old son has not had testing yet. I let him know we do have further testing we could do but that it's unlikely we will find any other mutations. He declines the appointment at this time. I will send him an email with information about further testing and GINA for his son. His son can call me directly to schedule.

## 2021-08-21 ENCOUNTER — Inpatient Hospital Stay: Payer: Medicare Other | Admitting: Licensed Clinical Social Worker

## 2021-08-21 ENCOUNTER — Inpatient Hospital Stay: Payer: Medicare Other

## 2021-10-30 ENCOUNTER — Other Ambulatory Visit: Payer: Medicare Other

## 2021-10-30 ENCOUNTER — Ambulatory Visit: Payer: Medicare Other | Admitting: Oncology

## 2023-08-20 IMAGING — MG MM BREAST LOCALIZATION CLIP
4 series · 4 of 12 positions shown · non-contrast
Comparison: Previous exam(s).

CLINICAL DATA: Status post ultrasound-guided biopsy of a mass in
the left breast.

EXAM:
3D DIAGNOSTIC LEFT MAMMOGRAM POST ULTRASOUND BIOPSY

[L CC synth-2D]
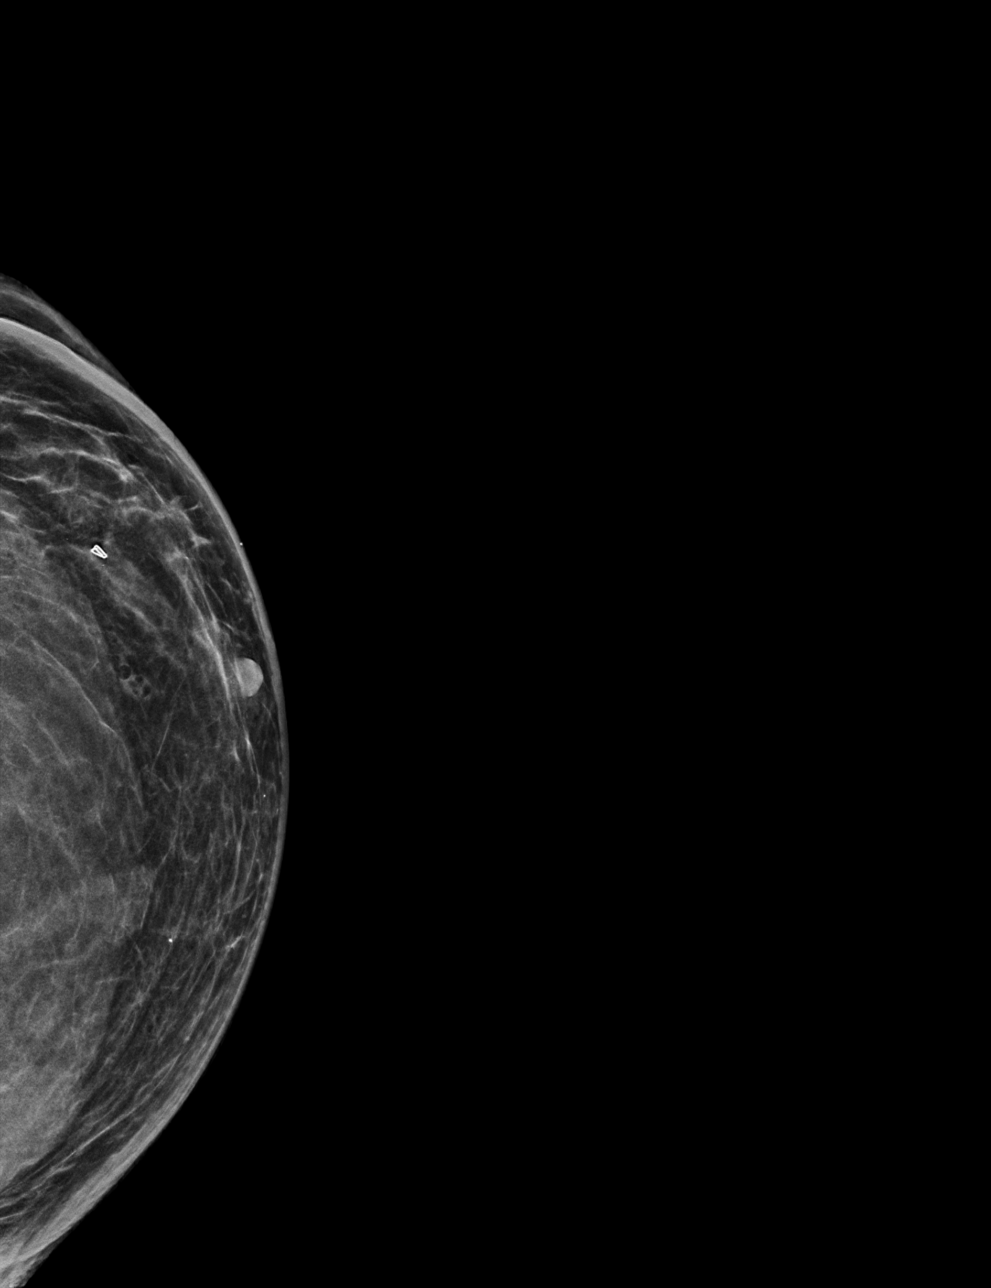

[L ML synth-2D]
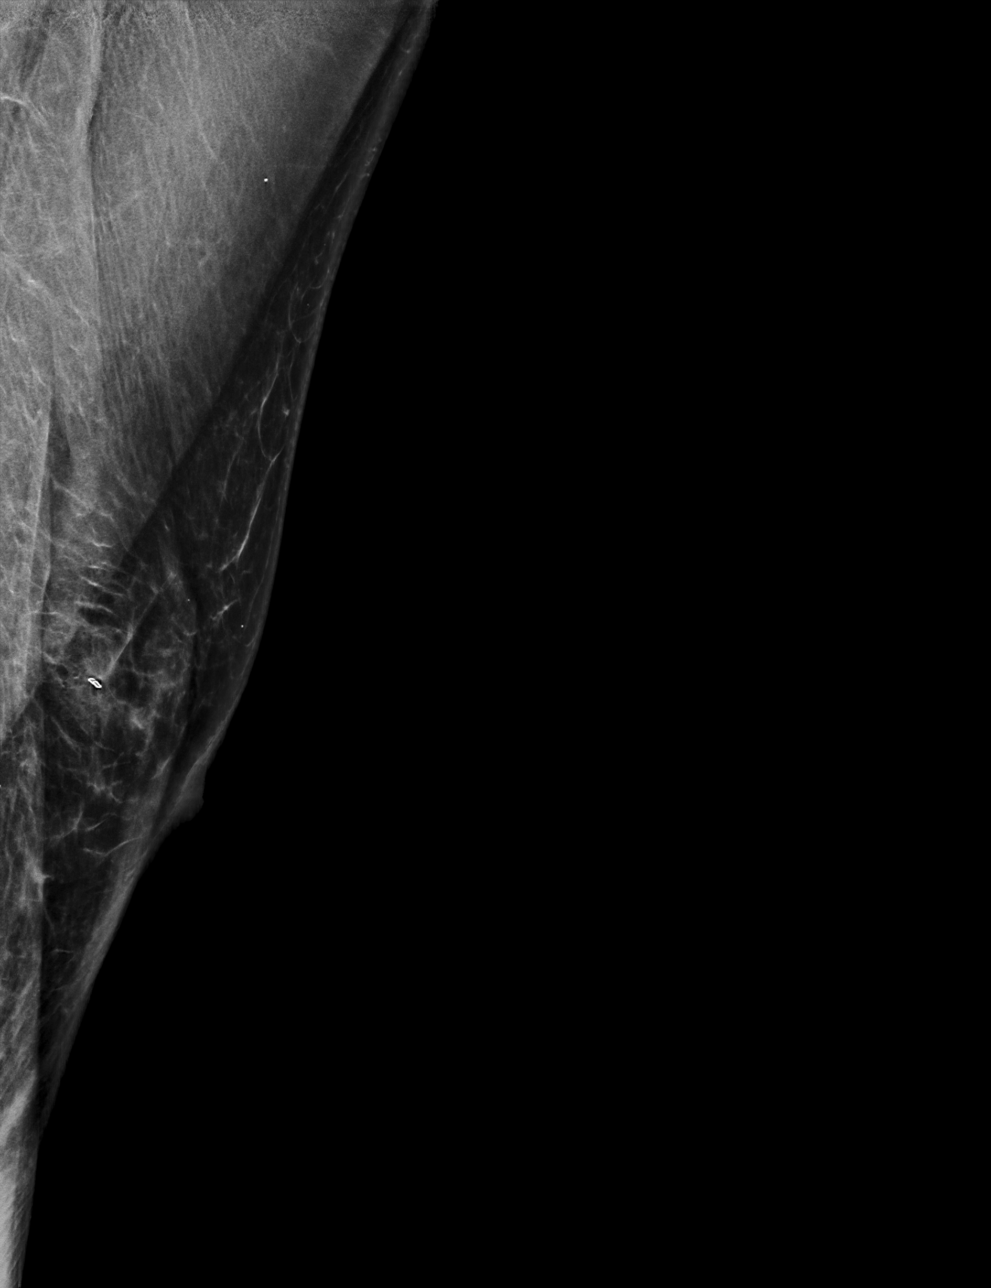

[L CC tomo · tomo slice 22/43.0]
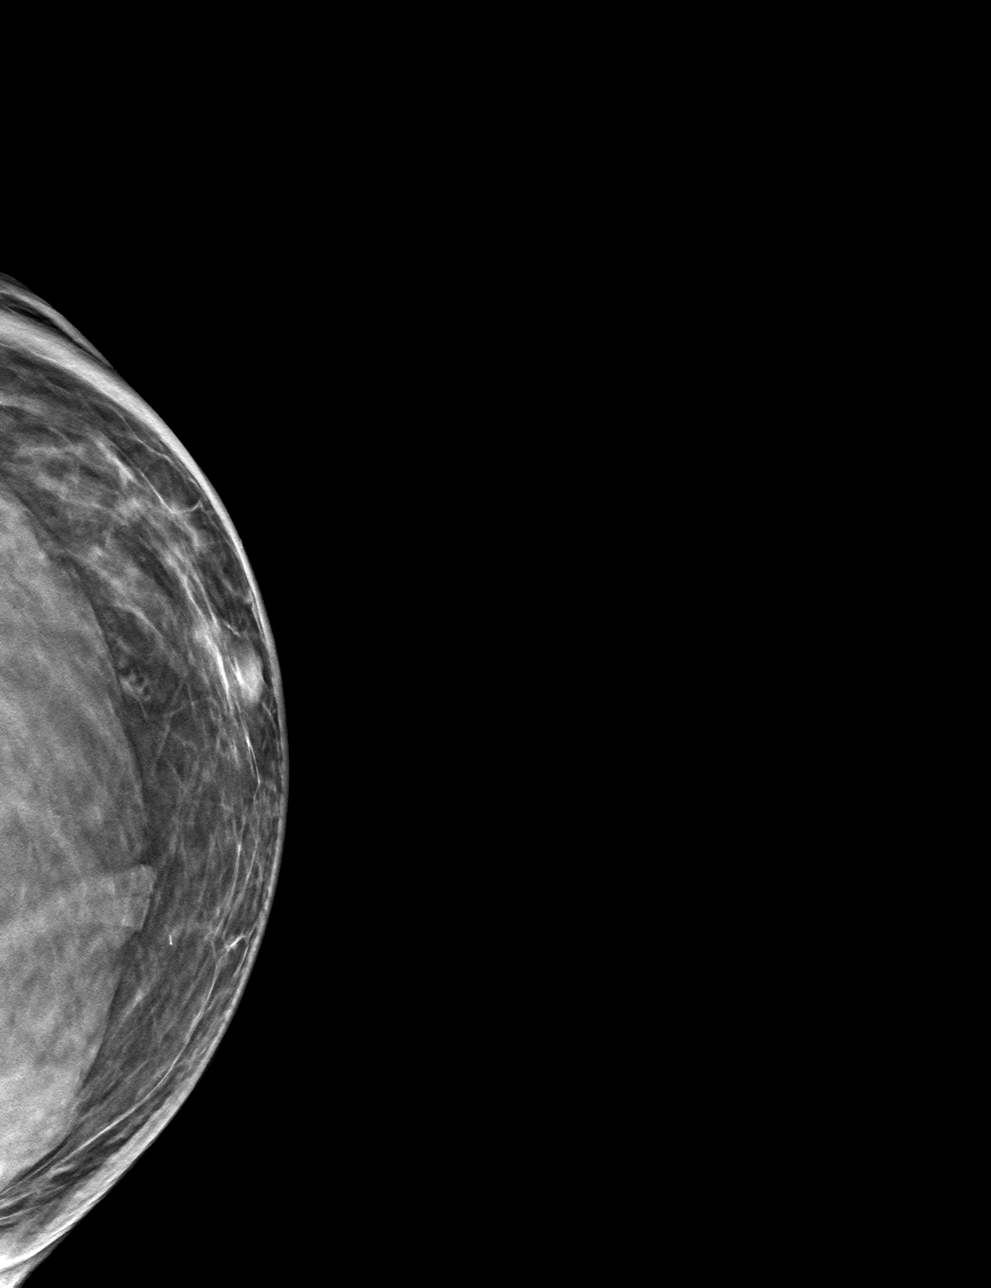

[L ML tomo · tomo slice 31/61.0]
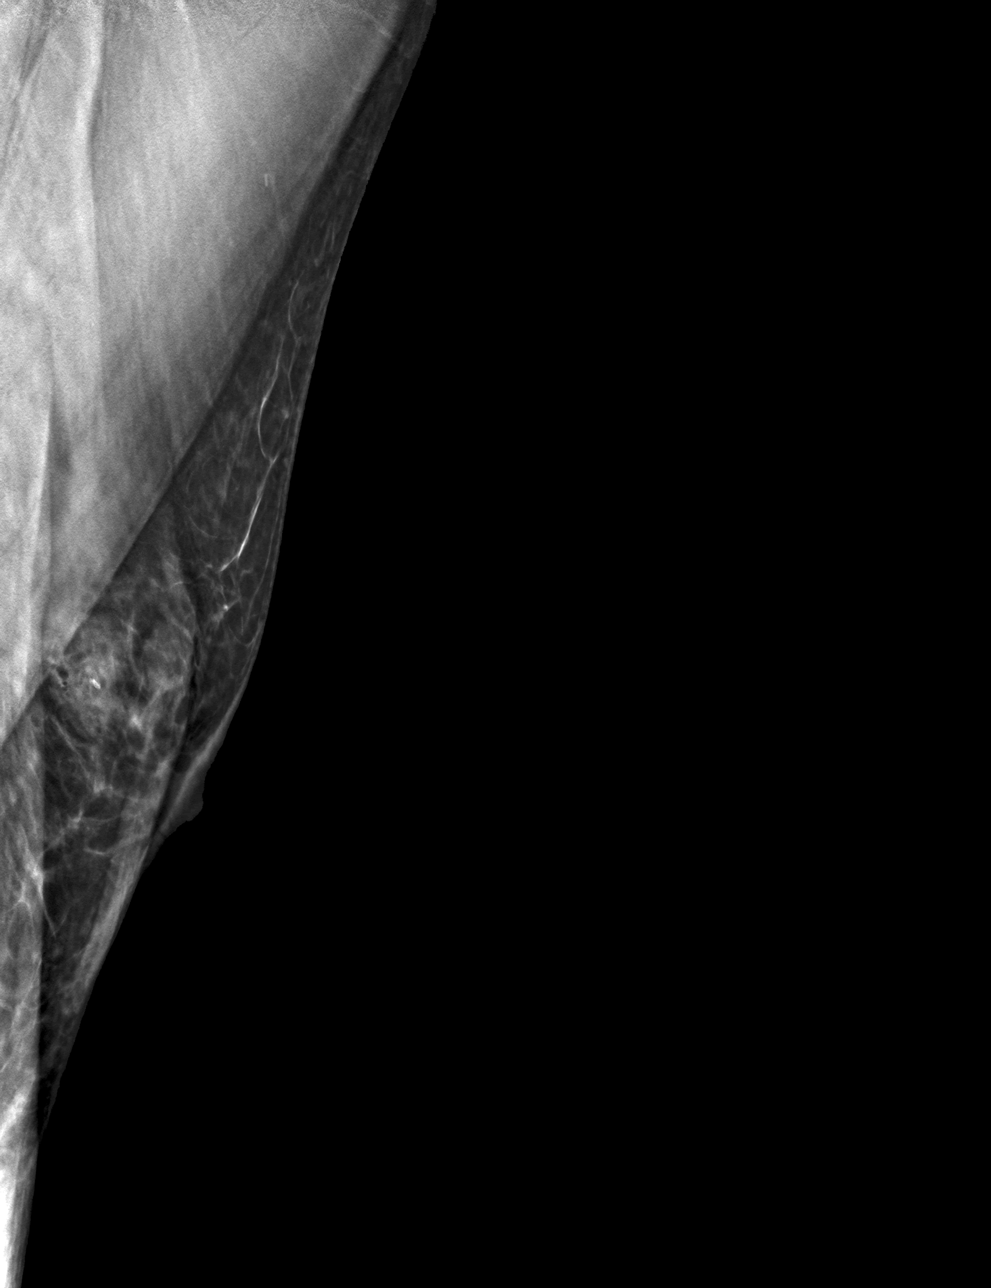

[4 of 12 positions shown; findings below may reference images not displayed]

FINDINGS: 3D Mammographic images were obtained following ultrasound guided
biopsy of a mass in the left breast. The biopsy marking clip is in
expected position at the site of biopsy.
IMPRESSION: Appropriate positioning of the heart shaped biopsy marking clip at
the site of biopsy in the left breast.

Final Assessment: Post Procedure Mammograms for Marker Placement

## 2023-09-05 IMAGING — MG MM BREAST SURGICAL SPECIMEN
1 series · 1 of 1 positions shown · non-contrast
Comparison: None Available.

CLINICAL DATA: Evaluate specimen

EXAM:
SPECIMEN RADIOGRAPH OF THE LEFT BREAST

[L]
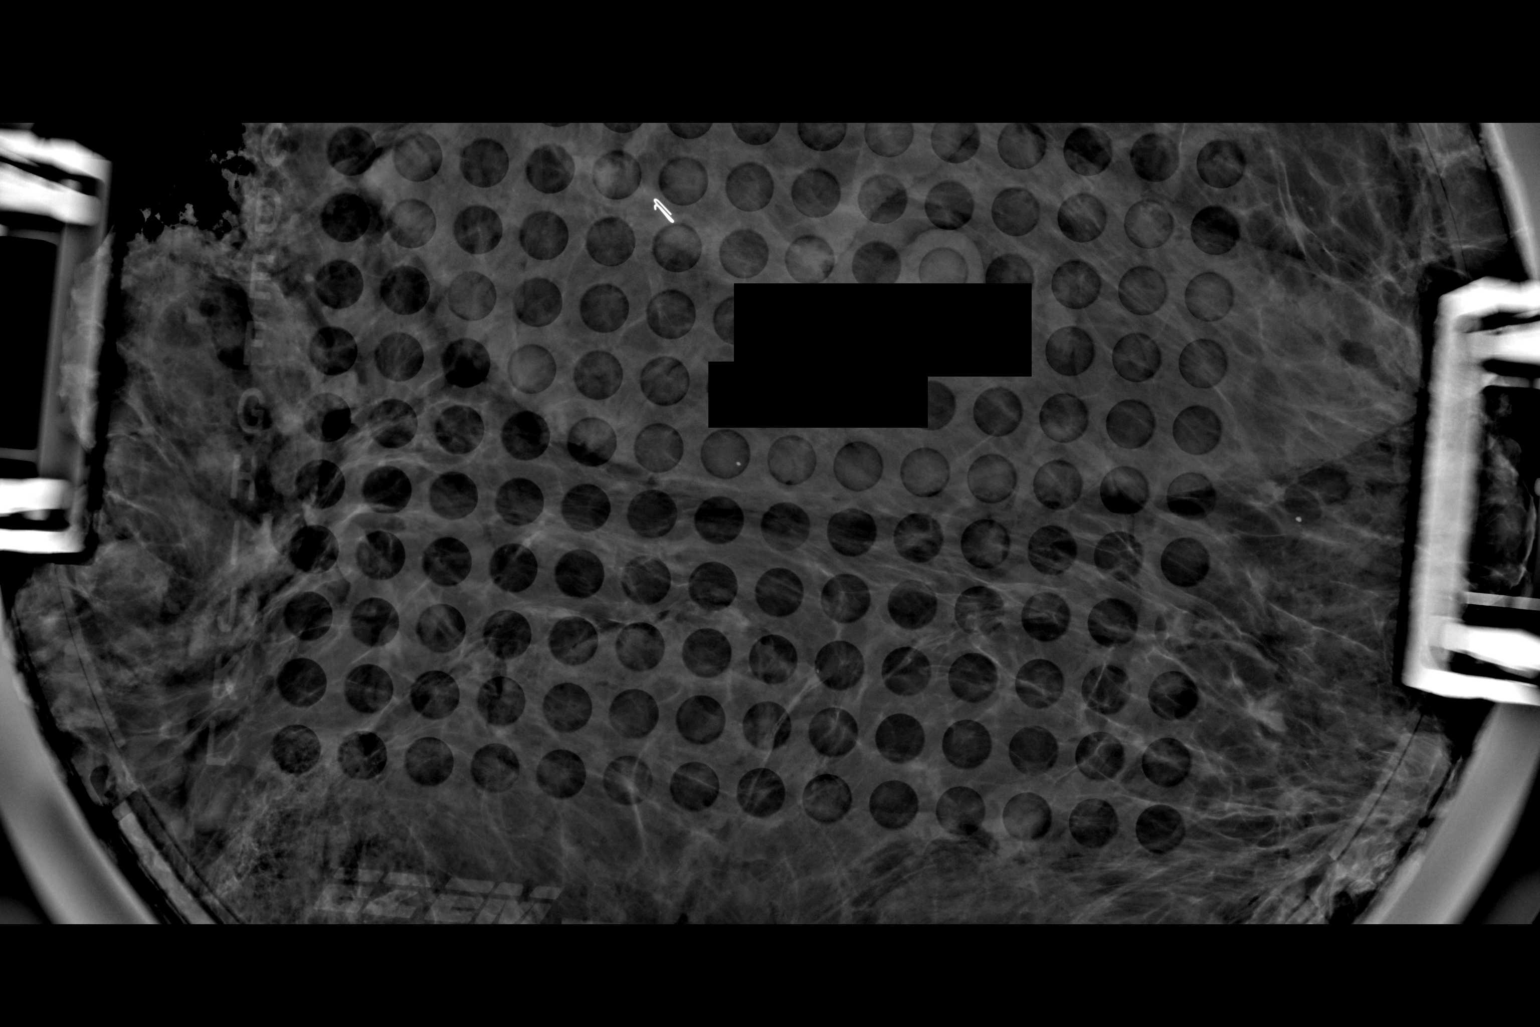

[1 of 1 positions shown; findings below may reference images not displayed]

FINDINGS: Status post excision of the left breast. The biopsy clip is within
the specimen.
IMPRESSION: Specimen radiograph of the left breast.
